# Patient Record
Sex: Female | Born: 1965 | Race: White | Hispanic: No | Smoking: Former smoker
Health system: Southern US, Community
[De-identification: ages and names within clinical notes are randomized; demographics above are authoritative.]

## PROBLEM LIST (undated history)

## (undated) DIAGNOSIS — G4730 Sleep apnea, unspecified: Secondary | ICD-10-CM

## (undated) DIAGNOSIS — I82409 Acute embolism and thrombosis of unspecified deep veins of unspecified lower extremity: Secondary | ICD-10-CM

## (undated) DIAGNOSIS — E785 Hyperlipidemia, unspecified: Secondary | ICD-10-CM

## (undated) DIAGNOSIS — K219 Gastro-esophageal reflux disease without esophagitis: Secondary | ICD-10-CM

## (undated) DIAGNOSIS — M129 Arthropathy, unspecified: Secondary | ICD-10-CM

## (undated) DIAGNOSIS — J45909 Unspecified asthma, uncomplicated: Secondary | ICD-10-CM

## (undated) DIAGNOSIS — F411 Generalized anxiety disorder: Secondary | ICD-10-CM

## (undated) DIAGNOSIS — I10 Essential (primary) hypertension: Secondary | ICD-10-CM

## (undated) HISTORY — DX: Generalized anxiety disorder: F41.1

## (undated) HISTORY — DX: Gastro-esophageal reflux disease without esophagitis: K21.9

## (undated) HISTORY — DX: Sleep apnea, unspecified: G47.30

## (undated) HISTORY — DX: Acute embolism and thrombosis of unspecified deep veins of unspecified lower extremity: I82.409

## (undated) HISTORY — DX: Essential (primary) hypertension: I10

## (undated) HISTORY — DX: Arthropathy, unspecified: M12.9

## (undated) HISTORY — DX: Unspecified asthma, uncomplicated: J45.909

## (undated) HISTORY — DX: Hyperlipidemia, unspecified: E78.5

---

## 2013-08-29 HISTORY — PX: CHOLECYSTECTOMY: SHX55

## 2013-08-29 HISTORY — PX: KNEE SURGERY: SHX244

## 2018-07-30 DIAGNOSIS — E669 Obesity, unspecified: Secondary | ICD-10-CM

## 2018-07-30 DIAGNOSIS — M5412 Radiculopathy, cervical region: Secondary | ICD-10-CM

## 2018-07-30 HISTORY — DX: Obesity, unspecified: E66.9

## 2018-07-30 HISTORY — DX: Radiculopathy, cervical region: M54.12

## 2019-08-30 HISTORY — PX: HERNIA REPAIR: SHX51

## 2020-06-25 DIAGNOSIS — F419 Anxiety disorder, unspecified: Secondary | ICD-10-CM | POA: Insufficient documentation

## 2020-06-25 DIAGNOSIS — M069 Rheumatoid arthritis, unspecified: Secondary | ICD-10-CM

## 2020-06-25 DIAGNOSIS — E119 Type 2 diabetes mellitus without complications: Secondary | ICD-10-CM | POA: Insufficient documentation

## 2020-06-25 DIAGNOSIS — I2699 Other pulmonary embolism without acute cor pulmonale: Secondary | ICD-10-CM | POA: Insufficient documentation

## 2020-06-25 DIAGNOSIS — F32A Depression, unspecified: Secondary | ICD-10-CM

## 2020-06-25 HISTORY — DX: Depression, unspecified: F32.A

## 2020-06-25 HISTORY — DX: Anxiety disorder, unspecified: F41.9

## 2020-06-25 HISTORY — DX: Type 2 diabetes mellitus without complications: E11.9

## 2020-06-25 HISTORY — DX: Rheumatoid arthritis, unspecified: M06.9

## 2020-06-25 HISTORY — DX: Other pulmonary embolism without acute cor pulmonale: I26.99

## 2021-09-28 ENCOUNTER — Encounter: Payer: Self-pay | Admitting: *Deleted

## 2021-09-28 ENCOUNTER — Encounter: Payer: Self-pay | Admitting: Cardiology

## 2021-09-28 DIAGNOSIS — K219 Gastro-esophageal reflux disease without esophagitis: Secondary | ICD-10-CM | POA: Insufficient documentation

## 2021-09-28 DIAGNOSIS — G4730 Sleep apnea, unspecified: Secondary | ICD-10-CM | POA: Insufficient documentation

## 2021-09-28 DIAGNOSIS — J45909 Unspecified asthma, uncomplicated: Secondary | ICD-10-CM | POA: Insufficient documentation

## 2021-11-04 DIAGNOSIS — I82409 Acute embolism and thrombosis of unspecified deep veins of unspecified lower extremity: Secondary | ICD-10-CM | POA: Insufficient documentation

## 2021-11-04 DIAGNOSIS — E785 Hyperlipidemia, unspecified: Secondary | ICD-10-CM | POA: Insufficient documentation

## 2021-11-04 DIAGNOSIS — M129 Arthropathy, unspecified: Secondary | ICD-10-CM | POA: Insufficient documentation

## 2021-11-04 DIAGNOSIS — I10 Essential (primary) hypertension: Secondary | ICD-10-CM | POA: Insufficient documentation

## 2021-11-04 DIAGNOSIS — F411 Generalized anxiety disorder: Secondary | ICD-10-CM | POA: Insufficient documentation

## 2021-11-04 NOTE — Progress Notes (Signed)
?Cardiology Office Note:   ? ?Date:  11/05/2021  ? ?ID:  Renee Parker, DOB [birth year], MRN [id number] ? ?PCP:  Janine Limbo, PA-C  ?Cardiologist:  Shirlee More, MD  ? ?Referring MD: Janine Limbo, PA-C ? ?ASSESSMENT:   ? ?1. Other chest pain   ?2. SOB (shortness of breath)   ?3. Claudication ([place])   ?4. Primary hypertension   ?5. Single subsegmental pulmonary embolism without acute cor pulmonale (HCC)   ?6. Chronic anticoagulation   ? ?PLAN:   ? ?In order of problems listed above: ? ?She has developed a rapidly progressive pattern of exertional shortness of breath and chest pain in the last few months differential diagnosis is sequelae of pulmonary embolism.  Including pulmonary artery hypertension versus CAD.  Further evaluation cardiac CTA. ?Symptoms quite suggestive of claudication check lower extremity segmental pressures ABIs ?Continue current treatment beta-blocker ACE inhibitor ?Continue her long-term anticoagulation Eliquis ?Stable diabetes managed by her PCP ? ? ? ?Next appointment 4 weeks ? ? ?Medication Adjustments/Labs and Tests Ordered: ?Current medicines are reviewed at length with the patient today.  Concerns regarding medicines are outlined above.  ?Orders Placed This Encounter  ?Procedures  ? CT CORONARY MORPH W/CTA COR W/SCORE W/CA W/CM &/OR WO/CM  ? Basic metabolic panel  ? EKG 12-Lead  ? VAS [place] LOWER EXT ART SEG MULTI (SEGMENTALS & LE RAYNAUDS)  ? ?Meds ordered this encounter  ?Medications  ? apixaban (ELIQUIS) 5 MG TABS tablet  ?  Sig: Take 1 tablet (5 mg total) by mouth 2 (two) times daily.  ?  Dispense:  60 tablet  ?  Refill:  11  ? ramipril (ALTACE) 5 MG capsule  ?  Sig: Take 1 capsule (5 mg total) by mouth daily.  ?  Dispense:  90 capsule  ?  Refill:  3  ? fenofibrate (TRICOR) 145 MG tablet  ?  Sig: Take 1 tablet (145 mg total) by mouth daily.  ?  Dispense:  90 tablet  ?  Refill:  3  ? metoprolol tartrate (LOPRESSOR) 100 MG tablet  ?  Sig: Take 1 tablet by mouth 2 hours before CT  ?  Dispense:   1 tablet  ?  Refill:  0  ?  ? ?Chief complaint exertional shortness of breath and chest tightness ? ?History of Present Illness:   ? ?Renee Parker is a 56 y.o. female with a history of DVT and pulmonary embolism 2021, asthma, sleep apnea type 2 diabetes hypertension dyslipidemia and asthma and sleep apnea who is being seen today for the evaluation of shortness of breath at the request of [place], PA-C.  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  She was treated with IVC filter removal and anticoagulation. ? ?She had done well until last few months and she developed a rapidly progressive pattern of shortness of breath with activities walking room to room at home greater than ADLs associated with chest tightness.  She has a brother in the 67s had recent bypass surgery father died in his 71s of CAD and heart failure.  Her anticoagulation has not been interrupted.  She also notices claudication in both legs knees to ankle which is very predictable with activity has no history of spine disease.  She has no edema orthopnea cough wheeze palpitation or syncope.  The symptoms have disrupted her life seeking medical attention. ? ?Past Medical History:  ?  Diagnosis Date  ? Anxiety 06/25/2020  ? Arthropathy   ? Asthma   ? Depression 06/25/2020  ? Diabetes mellitus ([place]) 06/25/2020  ? DVT (deep venous thrombosis) ([place])   ? Has been occuring in decreasing pattern for years  ? Dyslipidemia   ? GAD (generalized anxiety disorder)   ? GERD (gastroesophageal reflux disease)   ? Hypertension   ? Pulmonary emboli ([place]) 06/25/2020  ? Rheumatoid arthritis ([place]) 06/25/2020  ? Sleep apnea   ? CPAP  ? ? ?Past Surgical History:  ?Procedure Laterality Date  ? CHOLECYSTECTOMY  2015  ? HERNIA REPAIR  2021  ? lap hernia repair 09/2016  ? KNEE SURGERY Right 2015  ? Revision 10/2016  ? ? ?Current Medications: ?Current Meds   ?Medication Sig  ? albuterol (VENTOLIN HFA) 108 (90 Base) MCG/ACT inhaler Inhale 1-2 puffs into the lungs every 4 (four) hours as needed for shortness of breath.  ? b complex vitamins capsule Take 1 capsule by mouth daily.  ? BIOTIN PO Take 400 mg by mouth 2 (two) times daily.  ? budesonide-formoterol (SYMBICORT) 160-4.5 MCG/ACT inhaler Inhale 2 puffs into the lungs 2 (two) times daily.  ? busPIRone (BUSPAR) 15 MG tablet Take 1 tablet by mouth 2 (two) times daily.  ? dicyclomine (BENTYL) 20 MG tablet Take 20 mg by mouth daily as needed for spasms.  ? escitalopram (LEXAPRO) 20 MG tablet Take 20 mg by mouth daily.  ? insulin degludec (TRESIBA) 100 UNIT/ML FlexTouch Pen Inject 55 mLs into the skin daily.  ? insulin lispro (HUMALOG) 100 UNIT/ML injection Inject 150 Units into the skin daily.  ? metoprolol tartrate (LOPRESSOR) 100 MG tablet Take 1 tablet by mouth 2 hours before CT  ? omeprazole (PRILOSEC) 40 MG capsule Take 40 mg by mouth 2 (two) times daily.  ? [DISCONTINUED] apixaban (ELIQUIS) 5 MG TABS tablet Take 5 mg by mouth 2 (two) times daily.  ? [DISCONTINUED] fenofibrate (TRICOR) 145 MG tablet Take 145 mg by mouth daily.  ? [DISCONTINUED] ramipril (ALTACE) 5 MG capsule Take 1 tablet by mouth daily.  ?  ? ?Allergies:   Patient has no known allergies.  ? ?Social History  ? ?Socioeconomic History  ? Marital status: Divorced  ?  Spouse name: Not on file  ? Number of children: Not on file  ? Years of education: Not on file  ? Highest education level: Not on file  ?Occupational History  ? Not on file  ?Tobacco Use  ? Smoking status: Former  ?  Years: 5.00  ?  Types: Cigarettes  ?  Quit date: 40  ?  Years since quitting: 29.2  ?  Passive exposure: Past  ? Smokeless tobacco: Never  ?Vaping Use  ? Vaping Use: Never used  ?Substance and Sexual Activity  ? Alcohol use: Not Currently  ?  Comment: Occasional  ? Drug use: Never  ? Sexual activity: Not on file  ?Other Topics Concern  ? Not on file  ?Social History  Narrative  ? Not on file  ? ?Social Determinants of Health  ? ?Financial Resource Strain: Not on file  ?Food Insecurity: Not on file  ?Transportation Needs: Not on file  ?Physical Activity: Not on file  ?Stress: Not on file  ?Social Connections: Not on file  ?  ? ?Family History: ?The patient's family history includes Breast cancer in her maternal grandmother; Diabetes in her father, maternal grandmother, mother, and paternal grandmother; Heart attack in her father; Heart disease in her father; Hyperlipidemia  in her father; Hypertension in her father and mother; Ovarian cancer in her maternal grandmother; Stroke in her maternal grandmother. ? ?ROS:   ?ROS Please see the history of present illness.    ? All other systems reviewed and are negative. ? ?EKGs/Labs/Other Studies Reviewed:   ? ?The following studies were reviewed today: ? ? ?EKG:  EKG is sinus rhythm left axis deviation left anterior hemiblock ordered today.  The ekg ordered today is personally reviewed and demonstrates  ? ?Recent Labs: ?02/23/2021 creatinine 1.05 potassium 4.2 hemoglobin 11.8 ? ?Physical Exam:   ? ?VS:  BP (!) 144/72 (BP Location: Right Arm)   Pulse 81   Ht 5\' 5"  (1.651 m)   Wt 228 lb 9.6 oz (103.7 kg)   SpO2 96%   BMI 38.04 kg/m?    ? ?Wt Readings from Last 3 Encounters:  ?11/05/21 228 lb 9.6 oz (103.7 kg)  ?09/27/21 232 lb (105.2 kg)  ?  ? ?GEN: central obesity  Well nourished, well developed in no acute distress ?HEENT: Normal ?NECK: No JVD; No carotid bruits ?LYMPHATICS: No lymphadenopathy ?CARDIAC: Patient seen intermixed RRR, no murmurs, rubs, gallops ?RESPIRATORY:  Clear to auscultation without rales, wheezing or rhonchi  ?ABDOMEN: Soft, non-tender, non-distended ?MUSCULOSKELETAL:  No edema; No deformity  ?SKIN: Warm and dry ?NEUROLOGIC:  Alert and oriented x 3 ?PSYCHIATRIC:  Normal affect  ? ? ? ?Signed, ?Shirlee More, MD  ?11/05/2021 4:56 PM    ?[place] ?

## 2021-11-05 ENCOUNTER — Ambulatory Visit: Payer: BC Managed Care – PPO | Admitting: Cardiology

## 2021-11-05 ENCOUNTER — Other Ambulatory Visit: Payer: Self-pay

## 2021-11-05 ENCOUNTER — Encounter: Payer: Self-pay | Admitting: Cardiology

## 2021-11-05 VITALS — BP 144/72 | HR 81 | Ht 65.0 in | Wt 228.6 lb

## 2021-11-05 DIAGNOSIS — R0602 Shortness of breath: Secondary | ICD-10-CM

## 2021-11-05 DIAGNOSIS — I10 Essential (primary) hypertension: Secondary | ICD-10-CM

## 2021-11-05 DIAGNOSIS — I739 Peripheral vascular disease, unspecified: Secondary | ICD-10-CM | POA: Diagnosis not present

## 2021-11-05 DIAGNOSIS — I2693 Single subsegmental pulmonary embolism without acute cor pulmonale: Secondary | ICD-10-CM

## 2021-11-05 DIAGNOSIS — R0789 Other chest pain: Secondary | ICD-10-CM

## 2021-11-05 DIAGNOSIS — Z7901 Long term (current) use of anticoagulants: Secondary | ICD-10-CM

## 2021-11-05 MED ORDER — APIXABAN 5 MG PO TABS: 5.0000 mg | ORAL_TABLET | Freq: Two times a day (BID) | ORAL | 11 refills | Status: DC

## 2021-11-05 MED ORDER — METOPROLOL TARTRATE 100 MG PO TABS: ORAL_TABLET | ORAL | 0 refills | Status: DC

## 2021-11-05 MED ORDER — FENOFIBRATE 145 MG PO TABS: 145.0000 mg | ORAL_TABLET | Freq: Every day | ORAL | 3 refills | Status: DC

## 2021-11-05 MED ORDER — RAMIPRIL 5 MG PO CAPS: 5.0000 mg | ORAL_CAPSULE | Freq: Every day | ORAL | 3 refills | Status: DC

## 2021-11-05 NOTE — Patient Instructions (Addendum)
Medication Instructions:  ?Your physician recommends that you continue on your current medications as directed. Please refer to the Current Medication list given to you today. ? ?*If you need a refill on your cardiac medications before your next appointment, please call your pharmacy* ? ? ?Lab Work: ?Bmp- today  ? ?If you have labs (blood work) drawn today and your tests are completely normal, you will receive your results only by: ?MyChart Message (if you have MyChart) OR ?A paper copy in the mail ?If you have any lab test that is abnormal or we need to change your treatment, we will call you to review the results. ? ? ?Testing/Procedures: ?Your physician has requested that you have a lower extremity arterial exercise duplex. During this test, exercise and ultrasound are used to evaluate arterial blood flow in the legs. Allow one hour for this exam. There are no restrictions or special instructions. ? ?Your physician has ordered for you to have a cardia cta *refer to instructions given* ? ? ?Follow-Up: ?At [facility], you and your health needs are our priority.  As part of our continuing mission to provide you with exceptional heart care, we have created designated Provider Care Teams.  These Care Teams include your primary Cardiologist (physician) and Advanced Practice Providers (APPs -  Physician Assistants and Nurse Practitioners) who all work together to provide you with the care you need, when you need it. ? ?We recommend signing up for the patient portal called "MyChart".  Sign up information is provided on this After Visit Summary.  MyChart is used to connect with patients for Virtual Visits (Telemedicine).  Patients are able to view lab/test results, encounter notes, upcoming appointments, etc.  Non-urgent messages can be sent to your provider as well.   ?To learn more about what you can do with MyChart, go to [email or web address].   ? ?Your next appointment:   ?4 week(s) ? ?The format for your next  appointment:   ?In Person ? ?Provider:   ?Norman Herrlich, MD  ? ? ?Other Instructions ? ? ?Your cardiac CT will be scheduled at one of the below locations:  ? ?[facility] ?[address] ?[place], [place] [address] ?[phone] ? ? ?If scheduled at [facility], please arrive at the [facility] and Children's Entrance (Entrance C2) of [facility] 30 minutes prior to test start time. ?You can use the FREE valet parking offered at entrance C (encouraged to control the heart rate for the test)  ?Proceed to the [facility] Radiology Department (first floor) to check-in and test prep. ? ?All radiology patients and guests should use entrance C2 at [facility], accessed from [facility], even though the hospital's physical address listed is [address] ? ? ? ?Please follow these instructions carefully (unless otherwise directed): ? ? ?On the Night Before the Test: ?Be sure to Drink plenty of water. ?Do not consume any caffeinated/decaffeinated beverages or chocolate 12 hours prior to your test. ?Do not take any antihistamines 12 hours prior to your test. ?On the Day of the Test: ?Drink plenty of water until 1 hour prior to the test. ?Do not eat any food 4 hours prior to the test. ?You may take your regular medications prior to the test.  ?Take metoprolol (Lopressor) two hours prior to test. ?FEMALES- please wear underwire-free bra if available, avoid dresses & tight clothing ? ?     ?After the Test: ?Drink plenty of water. ?After receiving IV contrast,  you may experience a mild flushed feeling. This is normal. ?On occasion, you may experience a mild rash up to 24 hours after the test. This is not dangerous. If this occurs, you can take Benadryl 25 mg and increase your fluid intake. ?If you experience trouble breathing, this can be serious. If it is severe call 911 IMMEDIATELY. If it is mild, please call our office. ? ?We will call to schedule your test 2-4 weeks out  understanding that some insurance companies will need an authorization prior to the service being performed.  ? ?For non-scheduling related questions, please contact the cardiac imaging nurse navigator should you have any questions/concerns: ?Rockwell [place], Cardiac Imaging Nurse Navigator ?Larey Brick, Cardiac Imaging Nurse Navigator ?[facility] ?Direct Office Dial: [phone]  ? ?For scheduling needs, including cancellations and rescheduling, please call [place], [phone]. ?  ?

## 2021-11-06 LAB — BASIC METABOLIC PANEL
BUN/Creatinine Ratio: 17 (ref 9–23)
BUN: 12 mg/dL (ref 6–24)
CO2: 26 mmol/L (ref 20–29)
Calcium: 10 mg/dL (ref 8.7–10.2)
Chloride: 101 mmol/L (ref 96–106)
Creatinine, Ser: 0.71 mg/dL (ref 0.57–1.00)
Glucose: 199 mg/dL — ABNORMAL HIGH (ref 70–99)
Potassium: 4.5 mmol/L (ref 3.5–5.2)
Sodium: 142 mmol/L (ref 134–144)
eGFR: 100 mL/min/{1.73_m2} (ref >59)

## 2021-11-09 ENCOUNTER — Telehealth: Payer: Self-pay | Admitting: *Deleted

## 2021-11-09 NOTE — Telephone Encounter (Signed)
Left message for pt to call us back for blood work results. ?

## 2021-11-09 NOTE — Telephone Encounter (Signed)
-----   Message from Baldo Daub, MD sent at 11/07/2021 10:25 AM EDT ----- ?Normal or stable result ?

## 2021-11-15 ENCOUNTER — Other Ambulatory Visit: Payer: Self-pay

## 2021-11-15 ENCOUNTER — Ambulatory Visit (INDEPENDENT_AMBULATORY_CARE_PROVIDER_SITE_OTHER): Payer: BC Managed Care – PPO

## 2021-11-15 DIAGNOSIS — I739 Peripheral vascular disease, unspecified: Secondary | ICD-10-CM | POA: Diagnosis not present

## 2021-12-02 ENCOUNTER — Ambulatory Visit (HOSPITAL_COMMUNITY): Payer: BC Managed Care – PPO

## 2021-12-06 ENCOUNTER — Ambulatory Visit: Payer: BC Managed Care – PPO | Admitting: Cardiology

## 2021-12-09 ENCOUNTER — Telehealth (HOSPITAL_COMMUNITY): Payer: Self-pay | Admitting: *Deleted

## 2021-12-09 NOTE — Telephone Encounter (Signed)
Attempted to call patient regarding upcoming cardiac CT appointment. °Left message on voicemail with name and callback number ° °Ledia Hanford RN Navigator Cardiac Imaging °[facility] °[phone] Office °[phone] Cell ° °

## 2021-12-10 ENCOUNTER — Other Ambulatory Visit (HOSPITAL_COMMUNITY): Payer: Self-pay | Admitting: *Deleted

## 2021-12-10 ENCOUNTER — Other Ambulatory Visit: Payer: Self-pay | Admitting: Cardiology

## 2021-12-10 ENCOUNTER — Ambulatory Visit (HOSPITAL_COMMUNITY)
Admission: RE | Admit: 2021-12-10 | Discharge: 2021-12-10 | Disposition: A | Discharge status: Home / Self Care | Payer: BC Managed Care – PPO | Source: Ambulatory Visit | Attending: Cardiology | Admitting: Cardiology

## 2021-12-10 DIAGNOSIS — R0789 Other chest pain: Secondary | ICD-10-CM | POA: Diagnosis not present

## 2021-12-10 DIAGNOSIS — R079 Chest pain, unspecified: Secondary | ICD-10-CM

## 2021-12-10 MED ORDER — METOPROLOL TARTRATE 5 MG/5ML IV SOLN: 5.0000 mg | INTRAVENOUS | Status: DC | PRN

## 2021-12-10 MED ORDER — METOPROLOL TARTRATE 5 MG/5ML IV SOLN
INTRAVENOUS | Status: AC
  Administered 2021-12-10: 5 mg via INTRAVENOUS
  Filled 2021-12-10: qty 5

## 2021-12-10 MED ORDER — NITROGLYCERIN 0.4 MG SL SUBL
SUBLINGUAL_TABLET | SUBLINGUAL | Status: AC
  Filled 2021-12-10: qty 2

## 2021-12-10 MED ORDER — SODIUM CHLORIDE 0.9 % IV SOLN: Freq: Once | INTRAVENOUS | Status: AC

## 2021-12-10 MED ORDER — NITROGLYCERIN 0.4 MG SL SUBL
0.8000 mg | SUBLINGUAL_TABLET | Freq: Once | SUBLINGUAL | Status: AC
  Administered 2021-12-10: 0.8 mg via SUBLINGUAL

## 2021-12-10 MED ORDER — IVABRADINE HCL 5 MG PO TABS: ORAL_TABLET | ORAL | 0 refills | Status: DC

## 2021-12-10 MED ORDER — METOPROLOL TARTRATE 50 MG PO TABS: ORAL_TABLET | ORAL | 0 refills | Status: DC

## 2021-12-10 NOTE — Progress Notes (Signed)
Patient placed in the scanner for a CT Cardiac study.  Once in the scanner, patient HR jumped from 68 to 90's.  Applied BP cuff to patient to see if she had any room for more medications to bring the HR down.  Upon rechecking, pressure was 94/56.  The patient states that she "feels hot in the scanner and maybe a little nervous while in the scanner.  Informed after speaking to the MD that she will need to be rescheduled.  At this time, I believe the patient vagals down, turns pale, diaphoretic.  Patient was sitting, she is layed back down and fluids (NS) administered at rapid bolus, BP 76/56.  Patient moved to a stretcher, placed in trendelenburg and moved back to the nurses station for monitoring.   ?

## 2021-12-10 NOTE — Progress Notes (Signed)
Sending 10mg  ivbradine and 50mg  metoprolol for patient's next CCTA attempt per after consult with Dr. Bing Matter. ? ?Larey Brick RN Navigator Cardiac Imaging ?[facility] ?[phone] Office ?[phone] Cell ? ?

## 2021-12-10 NOTE — Progress Notes (Signed)
Spoke with Dr regarding patient BP 107/56 and fluids completed.  Patient has a slight headache and mild dizziness but able to stand at bedside with no wobbling or change in movements.  IV discontinued and to be discharge with MD approval.  Dominga Ferry at bedside to assess patient and notify MD. ? ?

## 2021-12-16 ENCOUNTER — Telehealth: Payer: Self-pay | Admitting: Cardiology

## 2021-12-16 ENCOUNTER — Telehealth: Payer: Self-pay

## 2021-12-16 ENCOUNTER — Other Ambulatory Visit: Payer: Self-pay

## 2021-12-16 NOTE — Telephone Encounter (Signed)
Patient was returning call for results. Please advise °

## 2021-12-16 NOTE — Telephone Encounter (Signed)
Patient called and stated that she had a CT ?

## 2021-12-17 ENCOUNTER — Other Ambulatory Visit: Payer: Self-pay

## 2021-12-17 DIAGNOSIS — R079 Chest pain, unspecified: Secondary | ICD-10-CM

## 2021-12-17 NOTE — Telephone Encounter (Signed)
Patient informed of results.  

## 2022-01-05 ENCOUNTER — Telehealth: Payer: Self-pay

## 2022-01-05 NOTE — Telephone Encounter (Signed)
Patient is scheduled for her lexiscan per Hayden RasmussenKalie Lambeth ?

## 2022-01-05 NOTE — Telephone Encounter (Signed)
-----   Message from Bridgette Habermann sent at 01/03/2022  8:45 AM EDT ----- ?Regarding: RE: lexiscan ?She is scheduled :) ?----- Message ----- ?From: Louie Casa, RN ?Sent: 12/31/2021   2:10 PM EDT ?To: Bridgette Habermann ?Subject: FW: lexiscan                                  ? ?Were you able to schedule this patient's lexiscan? ?----- Message ----- ?From: Enrigue Catena B ?Sent: 12/20/2021  10:58 AM EDT ?To: Louie Casa, RN ?Subject: RE: lexiscan                                  ? ?LVM for pt to call and schedule testing/kbl 12/20/21 ?----- Message ----- ?From: Louie Casa, RN ?Sent: 12/17/2021   4:02 PM EDT ?To: Bridgette Habermann ?Subject: lexiscan                                      ? ?Can you please call this patient and schedule her lexiscan. ? ?Thanks ? ? ? ? ? ? ? ? ? ?

## 2022-02-01 ENCOUNTER — Telehealth (HOSPITAL_COMMUNITY): Payer: Self-pay | Admitting: *Deleted

## 2022-02-01 NOTE — Telephone Encounter (Signed)
Left message on voicemail per DPR in reference to upcoming appointment scheduled on 02/08/22 at 8:15 with detailed instructions given per Myocardial Perfusion Study Information Sheet for the test. LM to arrive 15 minutes early, and that it is imperative to arrive on time for appointment to keep from having the test rescheduled. If you need to cancel or reschedule your appointment, please call the office within 24 hours of your appointment. Failure to do so may result in a cancellation of your appointment, and a $50 no show fee. Phone number given for call back for any questions.

## 2022-02-03 ENCOUNTER — Other Ambulatory Visit: Payer: Self-pay

## 2022-02-03 ENCOUNTER — Telehealth (HOSPITAL_COMMUNITY): Payer: Self-pay

## 2022-02-03 ENCOUNTER — Telehealth (HOSPITAL_COMMUNITY): Payer: Self-pay | Admitting: *Deleted

## 2022-02-03 DIAGNOSIS — R079 Chest pain, unspecified: Secondary | ICD-10-CM

## 2022-02-03 NOTE — Telephone Encounter (Signed)
Rescheduled for 6/21 and 02/17/22

## 2022-02-03 NOTE — Telephone Encounter (Signed)
Left message for patient to call and reschedule appointment for stress test on 02/08/22.  No physician in the office that day.

## 2022-02-08 ENCOUNTER — Ambulatory Visit: Payer: BC Managed Care – PPO

## 2022-02-09 ENCOUNTER — Telehealth (HOSPITAL_COMMUNITY): Payer: Self-pay | Admitting: *Deleted

## 2022-02-09 ENCOUNTER — Ambulatory Visit: Payer: BC Managed Care – PPO

## 2022-02-09 NOTE — Telephone Encounter (Signed)
Attempted to call patient regarding upcoming appointment- no answer, unable to leave a message.  Tauriel Scronce Jacqueline  

## 2022-02-16 ENCOUNTER — Ambulatory Visit: Payer: BC Managed Care – PPO

## 2022-02-17 ENCOUNTER — Ambulatory Visit: Payer: BC Managed Care – PPO

## 2022-10-24 ENCOUNTER — Other Ambulatory Visit: Payer: Self-pay | Admitting: Cardiology

## 2022-10-24 NOTE — Telephone Encounter (Signed)
Ramipril 5 mg # 90 only, message patient needs appointment for future refills 1st attempt

## 2022-11-15 ENCOUNTER — Other Ambulatory Visit: Payer: Self-pay | Admitting: Cardiology

## 2022-11-15 DIAGNOSIS — I2693 Single subsegmental pulmonary embolism without acute cor pulmonale: Secondary | ICD-10-CM

## 2022-11-15 NOTE — Telephone Encounter (Signed)
Prescription refill request for Eliquis received. Indication:PE/DVT Last office visit: 11/05/21 [facility])  Scr:  0.71 (11/05/21)  Age: 57 Weight: 103.7kg  Office visit and labs overdue. Called pt, no answer. Left message on voicemail

## 2022-11-16 NOTE — Telephone Encounter (Signed)
Pt has scheduled appt on 01/24/1923 with Dr Bettina Gavia. Refill sent to last until upcoming appt. Note placed on appt to have labs drawn at appt.

## 2023-01-17 ENCOUNTER — Other Ambulatory Visit: Payer: Self-pay

## 2023-01-23 NOTE — Progress Notes (Unsigned)
Cardiology Office Note:    Date:  01/24/2023   ID:  Renee Parker, DOB Nov 08, 1965, MRN [id number]  PCP:  Renee Blase, PA-C  Cardiologist:  Renee Herrlich, MD    Referring MD: Renee Blase, PA-C    ASSESSMENT:    1. Chronic anticoagulation   2. SOB (shortness of breath)   3. Primary hypertension    PLAN:    In order of problems listed above:  Continue her current anticoagulant long-term treatment advise lifelong anticoagulation after repeat pulmonary embolism She remains quite short of breath following pulmonary embolism had an elevated right hemidiaphragm is obstructive sleep apnea referred to pulmonary for an opinion also check labs with her anticoagulant including CBC proBNP Also check echocardiogram for pulmonary hypertension RV dysfunction Continue ACE inhibitor   Next appointment: 1 year   Medication Adjustments/Labs and Tests Ordered: Current medicines are reviewed at length with the patient today.  Concerns regarding medicines are outlined above.  No orders of the defined types were placed in this encounter.  No orders of the defined types were placed in this encounter.   No chief complaint on file.   History of Present Illness:    Renee Parker is a 57 y.o. female with a hx of previous DVT and pulmonary embolism 2021 asthma and sleep apnea type 2 diabetes hypertension dyslipidemia last seen 11/05/2021 for shortness of breath.  She had a coronary calcium score performed 12/10/2021 showing a coronary artery calcium score of 0 was not felt to benefit from an ischemia evaluation over read of the CT scan showed severe elevation of the right hemidiaphragm with atelectasis of the right lower lobe.  Duplex lower extremity 11/15/2021 showing no findings of thrombophlebitis.  Compliance with diet, lifestyle and medications: Yes  She remains quite short of breath with anything more than usual activities and request referral to pulmonary He just had a repeat home sleep study  done through PCP Not having chest pain edema palpitations or syncope Tolerates her anticoagulant without bleeding.  She is here today for refill Past Medical History:  Diagnosis Date   Anxiety 06/25/2020   Arthropathy    Asthma    Depression 06/25/2020   Diabetes mellitus (HCC) 06/25/2020   DVT (deep venous thrombosis) (HCC)    Has been occuring in decreasing pattern for years   Dyslipidemia    GAD (generalized anxiety disorder)    GERD (gastroesophageal reflux disease)    Hypertension    Pulmonary emboli (HCC) 06/25/2020   Rheumatoid arthritis (HCC) 06/25/2020   Sleep apnea    CPAP    Past Surgical History:  Procedure Laterality Date   CHOLECYSTECTOMY  2015   HERNIA REPAIR  2021   lap hernia repair 09/2016   KNEE SURGERY Right 2015   Revision 10/2016    Current Medications: Current Meds  Medication Sig   albuterol (VENTOLIN HFA) 108 (90 Base) MCG/ACT inhaler Inhale 1-2 puffs into the lungs every 4 (four) hours as needed for shortness of breath.   apixaban (ELIQUIS) 5 MG TABS tablet TAKE 1 TABLET(5 MG) BY MOUTH TWICE DAILY   b complex vitamins capsule Take 1 capsule by mouth daily.   BIOTIN PO Take 400 mg by mouth 2 (two) times daily.   budesonide-formoterol (SYMBICORT) 160-4.5 MCG/ACT inhaler Inhale 2 puffs into the lungs 2 (two) times daily.   busPIRone (BUSPAR) 15 MG tablet Take 1 tablet by mouth 2 (two) times daily.   escitalopram (LEXAPRO) 20 MG tablet Take 20 mg by mouth daily.   fenofibrate (  TRICOR) 145 MG tablet Take 1 tablet (145 mg total) by mouth daily.   insulin lispro (HUMALOG) 100 UNIT/ML injection Inject 150 Units into the skin daily.   omeprazole (PRILOSEC) 40 MG capsule Take 40 mg by mouth 2 (two) times daily.   OZEMPIC, 2 MG/DOSE, 8 MG/3ML SOPN Inject 2 mg into the skin once a week.   ramipril (ALTACE) 5 MG capsule Take 1 capsule (5 mg total) by mouth daily. Needs appointment for future refills / 1st attempt     Allergies:   Patient has no known  allergies.   Social History   Socioeconomic History   Marital status: Divorced    Spouse name: Not on file   Number of children: Not on file   Years of education: Not on file   Highest education level: Not on file  Occupational History   Not on file  Tobacco Use   Smoking status: Former    Years: 5    Types: Cigarettes    Quit date: 28    Years since quitting: 30.4    Passive exposure: Past   Smokeless tobacco: Never  Vaping Use   Vaping Use: Never used  Substance and Sexual Activity   Alcohol use: Not Currently    Comment: Occasional   Drug use: Never   Sexual activity: Not on file  Other Topics Concern   Not on file  Social History Narrative   Not on file   Social Determinants of Health   Financial Resource Strain: Not on file  Food Insecurity: Not on file  Transportation Needs: Not on file  Physical Activity: Not on file  Stress: Not on file  Social Connections: Not on file     Family History: The patient's family history includes Breast cancer in her maternal grandmother; Diabetes in her father, maternal grandmother, mother, and paternal grandmother; Heart attack in her father; Heart disease in her father; Hyperlipidemia in her father; Hypertension in her father and mother; Ovarian cancer in her maternal grandmother; Stroke in her maternal grandmother. ROS:   Please see the history of present illness.    All other systems reviewed and are negative.  EKGs/Labs/Other Studies Reviewed:    The following studies were reviewed today:  Cardiac Studies & Procedures          CT SCANS  CT CARDIAC SCORING (SELF PAY ONLY) 12/15/2021  Addendum 12/15/2021  9:51 AM ADDENDUM REPORT: 12/15/2021 09:49  CLINICAL DATA:  Risk stratification  EXAM: Coronary Calcium Score  TECHNIQUE: The patient was scanned on a [employer] scanner. Axial non-contrast 3 mm slices were carried out through the heart. The data set was analyzed on a dedicated work station and scored  using the Agatson method.  FINDINGS: Non-cardiac: See separate report from [facility].  Ascending Aorta: Normal  Pericardium: Normal  Coronary arteries: Normal origin  IMPRESSION: Coronary calcium score of 0.   Electronically Signed By: Gypsy Balsam M.D. On: 12/15/2021 09:49  Narrative EXAM: OVER-READ INTERPRETATION  CT CHEST  The following report is an over-read performed by radiologist Dr. Trudie Reed of [facility] on 12/10/2021. This over-read does not include interpretation of cardiac or coronary anatomy or pathology. The coronary calcium score interpretation by the cardiologist is attached.  COMPARISON:  None.  FINDINGS: Severe elevation of the right hemidiaphragm with subsegmental atelectasis or scarring in the right lower lobe. Within the visualized portions of the thorax there are no suspicious appearing pulmonary nodules or masses, there is no acute consolidative airspace  disease, no pleural effusions, no pneumothorax and no lymphadenopathy. Visualized portions of the upper abdomen are unremarkable. There are no aggressive appearing lytic or blastic lesions noted in the visualized portions of the skeleton.  IMPRESSION: 1. Severe elevation of the right hemidiaphragm.  Electronically Signed: By: Trudie Reed M.D. On: 12/10/2021 10:36           Recent Labs: 11/08/2021 BMP was normal potassium 4.5 creatinine 0.71 last CBC August 2022 hemoglobin 14 platelets 312,000 Lipid profile 04/20/2021 cholesterol 174 LDL 110  Physical Exam:    VS:  BP 128/76   Pulse 98   Ht 5\' 5"  (1.651 m)   Wt 218 lb 6.4 oz (99.1 kg)   SpO2 93%   BMI 36.34 kg/m     Wt Readings from Last 3 Encounters:  01/24/23 218 lb 6.4 oz (99.1 kg)  11/05/21 228 lb 9.6 oz (103.7 kg)  09/27/21 232 lb (105.2 kg)     GEN:  Well nourished, well developed in no acute distress HEENT: Normal NECK: No JVD; No carotid bruits LYMPHATICS: No  lymphadenopathy CARDIAC: RRR, no murmurs, rubs, gallops RESPIRATORY:  Clear to auscultation without rales, wheezing or rhonchi  ABDOMEN: Soft, non-tender, non-distended MUSCULOSKELETAL:  No edema; No deformity  SKIN: Warm and dry NEUROLOGIC:  Alert and oriented x 3 PSYCHIATRIC:  Normal affect    Signed, Renee Herrlich, MD  01/24/2023 4:46 PM     [facility]

## 2023-01-24 ENCOUNTER — Ambulatory Visit: Payer: BC Managed Care – PPO | Attending: Cardiology | Admitting: Cardiology

## 2023-01-24 ENCOUNTER — Encounter: Payer: Self-pay | Admitting: Cardiology

## 2023-01-24 VITALS — BP 128/76 | HR 98 | Ht 65.0 in | Wt 218.4 lb

## 2023-01-24 DIAGNOSIS — Z7901 Long term (current) use of anticoagulants: Secondary | ICD-10-CM | POA: Diagnosis not present

## 2023-01-24 DIAGNOSIS — R0602 Shortness of breath: Secondary | ICD-10-CM | POA: Diagnosis not present

## 2023-01-24 DIAGNOSIS — I10 Essential (primary) hypertension: Secondary | ICD-10-CM

## 2023-01-24 NOTE — Patient Instructions (Addendum)
Medication Instructions:  Your physician recommends that you continue on your current medications as directed. Please refer to the Current Medication list given to you today.   *If you need a refill on your cardiac medications before your next appointment, please call your pharmacy*   Lab Work: Your physician recommends that you return for lab work in:   Labs today: BMP, CBC, Pro BNP  If you have labs (blood work) drawn today and your tests are completely normal, you will receive your results only by: MyChart Message (if you have MyChart) OR A paper copy in the mail If you have any lab test that is abnormal or we need to change your treatment, we will call you to review the results.   Testing/Procedures: Your physician has requested that you have an echocardiogram. Echocardiography is a painless test that uses sound waves to create images of your heart. It provides your doctor with information about the size and shape of your heart and how well your heart's chambers and valves are working. This procedure takes approximately one hour. There are no restrictions for this procedure. Please do NOT wear cologne, perfume, aftershave, or lotions (deodorant is allowed). Please arrive 15 minutes prior to your appointment time.    Follow-Up: At [facility], you and your health needs are our priority.  As part of our continuing mission to provide you with exceptional heart care, we have created designated Provider Care Teams.  These Care Teams include your primary Cardiologist (physician) and Advanced Practice Providers (APPs -  Physician Assistants and Nurse Practitioners) who all work together to provide you with the care you need, when you need it.  We recommend signing up for the patient portal called "MyChart".  Sign up information is provided on this After Visit Summary.  MyChart is used to connect with patients for Virtual Visits (Telemedicine).  Patients are able to view lab/test  results, encounter notes, upcoming appointments, etc.  Non-urgent messages can be sent to your provider as well.   To learn more about what you can do with MyChart, go to [email or web address].    Your next appointment:   1 year(s)  Provider:   Norman Herrlich, MD    Other Instructions None

## 2023-02-03 ENCOUNTER — Telehealth: Payer: Self-pay

## 2023-02-03 LAB — BASIC METABOLIC PANEL
BUN/Creatinine Ratio: 18 (ref 9–23)
BUN: 10 mg/dL (ref 6–24)
CO2: 29 mmol/L (ref 20–29)
Calcium: 9.9 mg/dL (ref 8.7–10.2)
Chloride: 100 mmol/L (ref 96–106)
Creatinine, Ser: 0.55 mg/dL — ABNORMAL LOW (ref 0.57–1.00)
Glucose: 138 mg/dL — ABNORMAL HIGH (ref 70–99)
Potassium: 4.3 mmol/L (ref 3.5–5.2)
Sodium: 141 mmol/L (ref 134–144)
eGFR: 107 mL/min/{1.73_m2} (ref >59)

## 2023-02-03 LAB — PRO B NATRIURETIC PEPTIDE: NT-Pro BNP: 49 pg/mL (ref 0–287)

## 2023-02-03 LAB — CBC
Hematocrit: 43 % (ref 34.0–46.6)
Hemoglobin: 14.1 g/dL (ref 11.1–15.9)
MCH: 29.4 pg (ref 26.6–33.0)
MCHC: 32.8 g/dL (ref 31.5–35.7)
MCV: 90 fL (ref 79–97)
Platelets: 268 10*3/uL (ref 150–450)
RBC: 4.79 x10E6/uL (ref 3.77–5.28)
RDW: 12.8 % (ref 11.7–15.4)
WBC: 9.6 10*3/uL (ref 3.4–10.8)

## 2023-02-03 NOTE — Telephone Encounter (Signed)
Pt returned call, informed pt provider notes have been posted via mychart as well.

## 2023-02-03 NOTE — Telephone Encounter (Signed)
Patient notified through my chart.

## 2023-02-03 NOTE — Telephone Encounter (Signed)
-----   Message from Baldo Daub, MD sent at 02/03/2023  7:37 AM EDT ----- Normal or stable result

## 2023-02-22 ENCOUNTER — Ambulatory Visit: Payer: BC Managed Care – PPO | Attending: Cardiology

## 2023-02-22 DIAGNOSIS — I10 Essential (primary) hypertension: Secondary | ICD-10-CM

## 2023-02-22 DIAGNOSIS — R0602 Shortness of breath: Secondary | ICD-10-CM | POA: Diagnosis not present

## 2023-02-22 DIAGNOSIS — Z7901 Long term (current) use of anticoagulants: Secondary | ICD-10-CM

## 2023-02-22 LAB — ECHOCARDIOGRAM COMPLETE: S' Lateral: 3.3 cm

## 2023-03-08 ENCOUNTER — Institutional Professional Consult (permissible substitution): Payer: BC Managed Care – PPO | Admitting: Pulmonary Disease

## 2023-03-21 ENCOUNTER — Other Ambulatory Visit: Payer: Self-pay | Admitting: Cardiology

## 2023-03-31 ENCOUNTER — Other Ambulatory Visit: Payer: Self-pay | Admitting: Cardiology

## 2023-03-31 DIAGNOSIS — I2693 Single subsegmental pulmonary embolism without acute cor pulmonale: Secondary | ICD-10-CM

## 2023-03-31 NOTE — Telephone Encounter (Signed)
Prescription refill request for Eliquis received. Indication:dvt Last office visit:5/24 Scr:0.55 6/24 Age: 57 Weight:99.1  kg  Prescription refilled

## 2023-05-01 IMAGING — CT CT CARDIAC CORONARY ARTERY CALCIUM SCORE
3 series · 14 of 20 positions shown, 15 images · non-contrast
Comparison: None.
COMPARISON: None.

Addendum:
EXAM:
OVER-READ INTERPRETATION  CT CHEST

The following report is an over-read performed by radiologist Dr.
Hosea Bloomquist [REDACTED] on 12/10/2021. This
over-read does not include interpretation of cardiac or coronary
anatomy or pathology. The coronary calcium score interpretation by
the cardiologist is attached.
CLINICAL DATA: Risk stratification
Coronary Calcium Score
TECHNIQUE: The patient was scanned on a [employer] scanner. Axial
non-contrast 3 mm slices were carried out through the heart. The
data set was analyzed on a dedicated work station and scored using
the Agatson method.

[Series 3: ds_cascseq 3.0 sa36 70% (id) · axial · 0.39mm/px · z∈[+1281,+1371]mm · 6 of 85 slices shown]
[im 13/85  vessel]
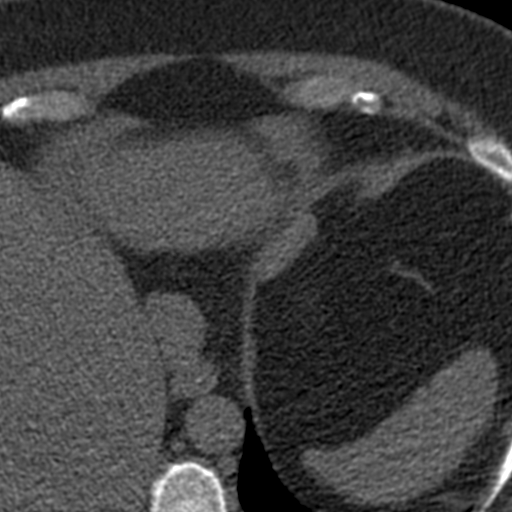
[im 25/85  vessel]
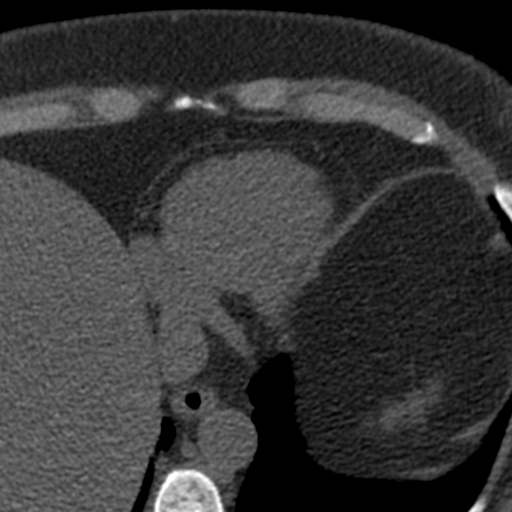
[im 37/85  vessel]
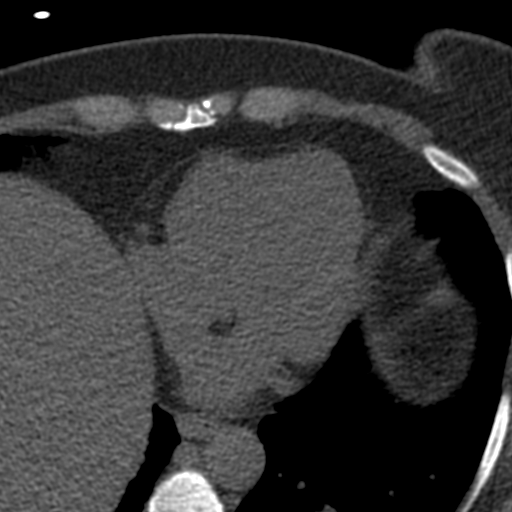
[im 49/85  vessel]
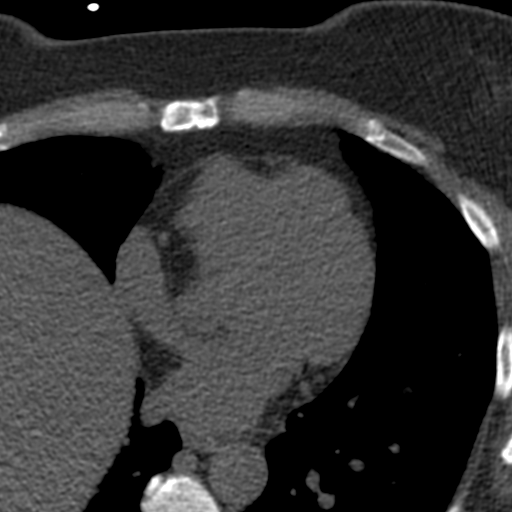
[im 61/85  vessel]
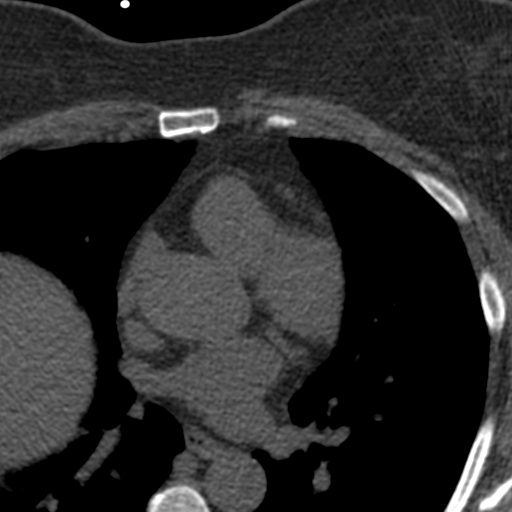
[im 73/85  vessel]
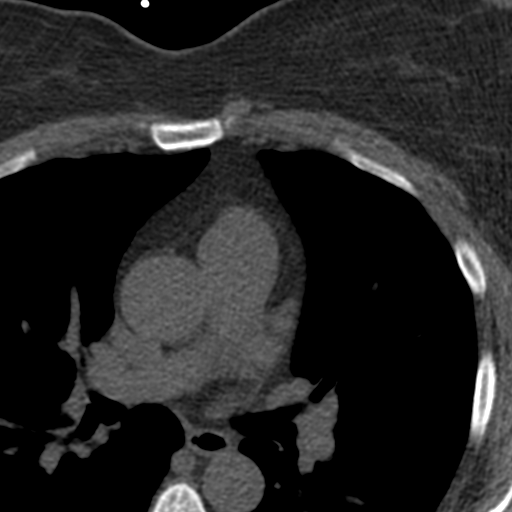

[Series 4: ds_cascseq 2.0 sa36 70% (id) · axial · 0.77mm/px · z∈[+1288,+1364]mm · 4 of 64 slices shown]
[im 13/64  vessel]
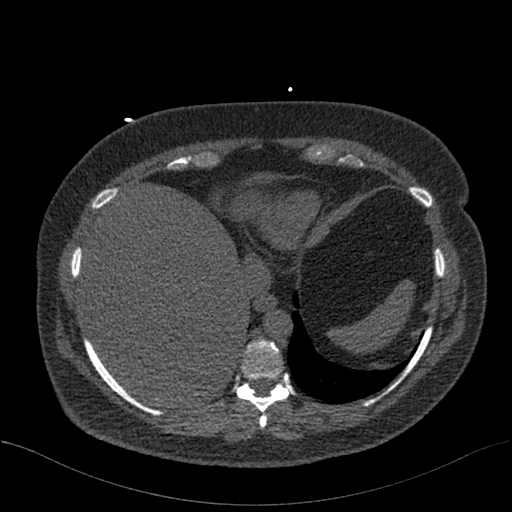
[im 26/64  vessel]
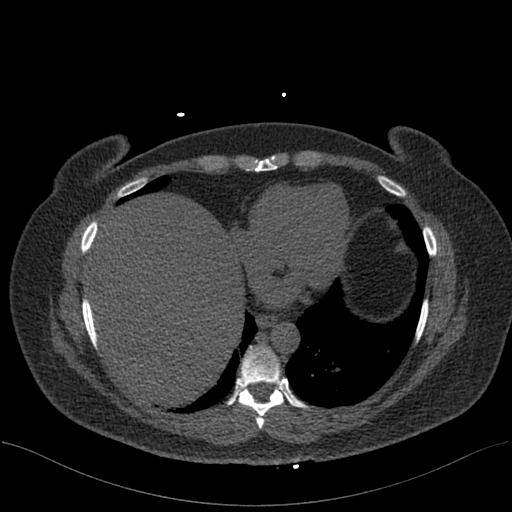
[im 38/64  vessel]
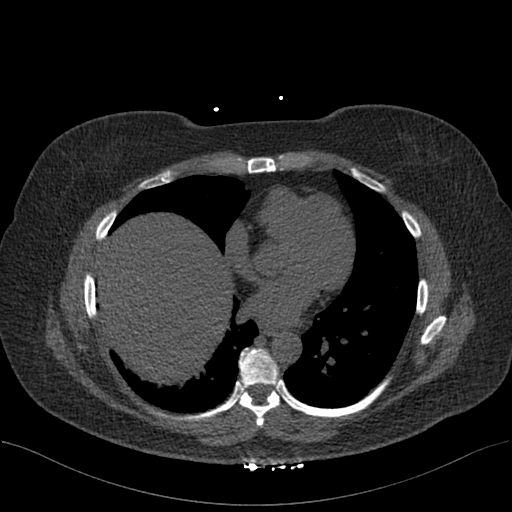
[im 51/64  vessel]
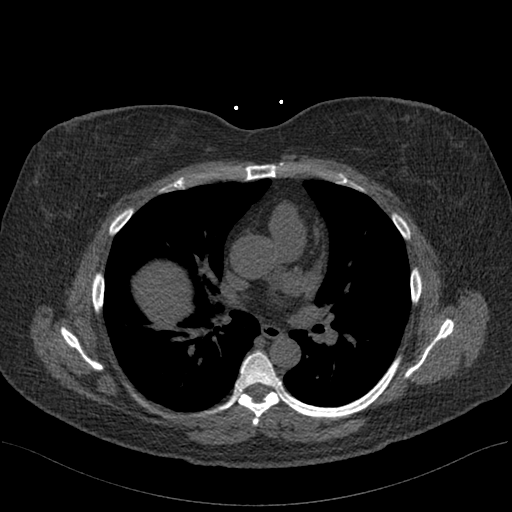

[Series 5: lungs full fov · axial · 0.77mm/px · z∈[+1288,+1364]mm · 4 of 64 slices shown, 5 images]
[im 13/64  vessel]
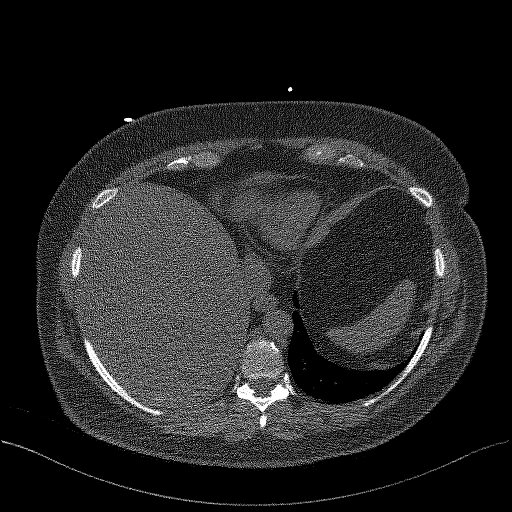
[im 13/64  lung]
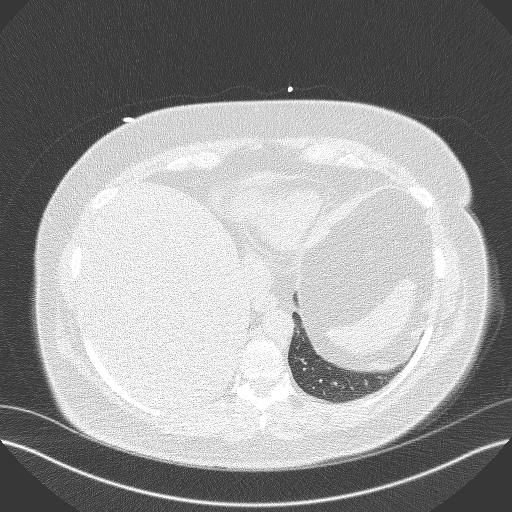
[im 26/64  vessel]
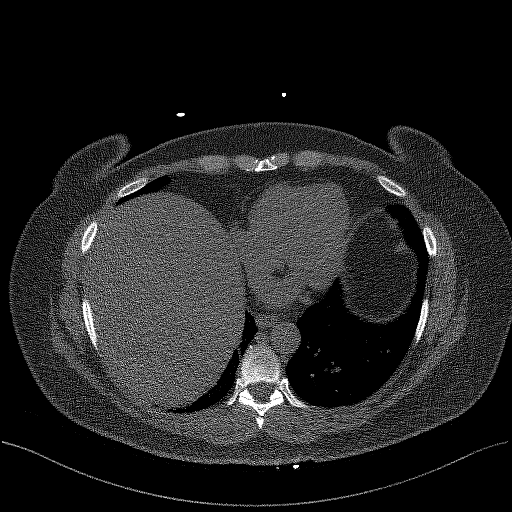
[im 38/64  vessel]
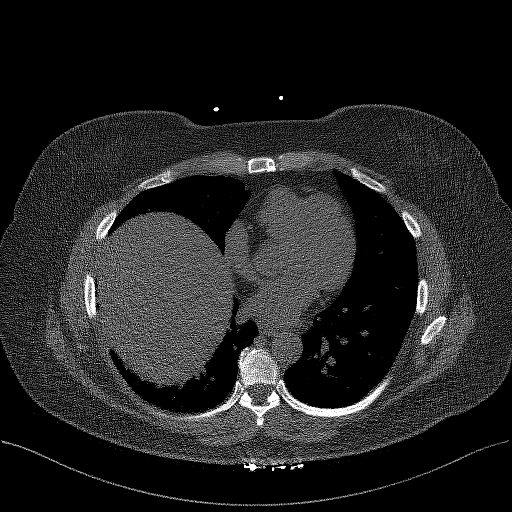
[im 51/64  vessel]
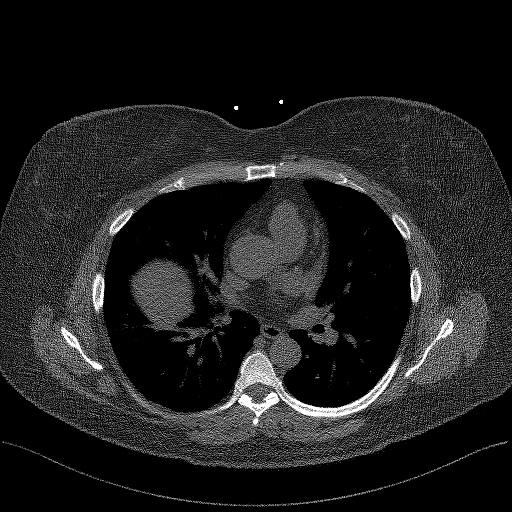

[14 of 20 positions shown; findings below may reference images not displayed]

FINDINGS: Severe elevation of the right hemidiaphragm with subsegmental
atelectasis or scarring in the right lower lobe. Within the
visualized portions of the thorax there are no suspicious appearing
pulmonary nodules or masses, there is no acute consolidative
airspace disease, no pleural effusions, no pneumothorax and no
lymphadenopathy. Visualized portions of the upper abdomen are
unremarkable. There are no aggressive appearing lytic or blastic
lesions noted in the visualized portions of the skeleton.
IMPRESSION: 1. Severe elevation of the right hemidiaphragm.
FINDINGS: Non-cardiac: See separate report from [REDACTED].

Ascending Aorta: Normal

Pericardium: Normal

Coronary arteries: Normal origin
IMPRESSION: Coronary calcium score of 0.

*** End of Addendum ***
EXAM:
OVER-READ INTERPRETATION  CT CHEST

The following report is an over-read performed by radiologist Dr.
Hosea Bloomquist [REDACTED] on 12/10/2021. This
over-read does not include interpretation of cardiac or coronary
anatomy or pathology. The coronary calcium score interpretation by
the cardiologist is attached.
FINDINGS: Severe elevation of the right hemidiaphragm with subsegmental
atelectasis or scarring in the right lower lobe. Within the
visualized portions of the thorax there are no suspicious appearing
pulmonary nodules or masses, there is no acute consolidative
airspace disease, no pleural effusions, no pneumothorax and no
lymphadenopathy. Visualized portions of the upper abdomen are
unremarkable. There are no aggressive appearing lytic or blastic
lesions noted in the visualized portions of the skeleton.
IMPRESSION: 1. Severe elevation of the right hemidiaphragm.

## 2023-05-08 ENCOUNTER — Encounter: Payer: Self-pay | Admitting: Pulmonary Disease

## 2023-05-08 ENCOUNTER — Ambulatory Visit: Payer: BC Managed Care – PPO | Admitting: Pulmonary Disease

## 2023-05-08 VITALS — BP 104/68 | HR 68 | Temp 97.4°F | Ht 65.0 in | Wt 201.0 lb

## 2023-05-08 DIAGNOSIS — M05732 Rheumatoid arthritis with rheumatoid factor of left wrist without organ or systems involvement: Secondary | ICD-10-CM

## 2023-05-08 DIAGNOSIS — J4540 Moderate persistent asthma, uncomplicated: Secondary | ICD-10-CM | POA: Diagnosis not present

## 2023-05-08 DIAGNOSIS — J849 Interstitial pulmonary disease, unspecified: Secondary | ICD-10-CM

## 2023-05-08 DIAGNOSIS — M05731 Rheumatoid arthritis with rheumatoid factor of right wrist without organ or systems involvement: Secondary | ICD-10-CM

## 2023-05-08 DIAGNOSIS — E271 Primary adrenocortical insufficiency: Secondary | ICD-10-CM | POA: Diagnosis not present

## 2023-05-08 NOTE — Patient Instructions (Signed)
Will get records from her primary care regarding the sleep study Check CBC with differential, IgE and blood test for autoimmune disease Make referral to rheumatology for rheumatoid arthritis Will give you samples of Trelegy 200.  If you like the inhaler then let us know and we can call in a prescription Schedule high-res CT at next available Schedule PFTs in 3 months Return to clinic in 3 months after PFTs

## 2023-05-08 NOTE — Progress Notes (Signed)
Renee Parker    [id number]    04/13/1966  Primary Care Physician:O'Buch, Pauline Good  Referring Physician: Eunice Blase, PA-C [address] [place],  [place] [address]  Chief complaint: Consult for dyspnea  HPI: 57 y.o. who  has a past medical history of Anxiety (06/25/2020), Arthropathy, Asthma, Depression (06/25/2020), Diabetes mellitus (HCC) (06/25/2020), DVT (deep venous thrombosis) (HCC), Dyslipidemia, GAD (generalized anxiety disorder), GERD (gastroesophageal reflux disease), Hypertension, Pulmonary emboli (HCC) (06/25/2020), Rheumatoid arthritis (HCC) (06/25/2020), and Sleep apnea.   History notable for right hemidiaphragm elevation, restrictive lung disease, PE 2021, asthma, OSA Complains of dyspnea on exertion for the past 4 months.  She has symptoms at rest and with exertion.  Has minimal cough with white mucus.  Occasional wheezing. She was given Symbicort a few months ago but had to stop due to palpitations.  Not on albuterol inhaler.  History notable for unprovoked PE in 2021 while she was living in [place] and she is maintained on Eliquis continuously with no break in therapy.  She also has a history of longstanding OSA and her primary care at [facility] recently repeated her sleep study and put her on CPAP 10 cm which does not appear to be adequate pressure as she feels suffocated.  DME company has told that she does not qualify for a new CPAP machine for unclear reasons.  Recently underwent a cardiac evaluation by Dr. Dulce Sellar with normal echocardiogram and normal CT coronaries.  Lung imaging does show right hemidiaphragm elevation with associated atelectasis.  She also has history of longstanding rheumatoid arthritis and was previously on Celebrex which was stopped.  She is currently not getting any therapy for rheumatoid arthritis and notes joint pain, morning stiffness   Pets: Occupation: Exposures: No mold, hot tub, Jacuzzi.  No feather pillows or  comforters Smoking history: Minimal smoker as a teenager Travel history: Originally from [place].  Moved to [place] in 2021 Relevant family history: No family history of lung disease   Outpatient Encounter Medications as of 05/08/2023  Medication Sig   albuterol (VENTOLIN HFA) 108 (90 Base) MCG/ACT inhaler Inhale 1-2 puffs into the lungs every 4 (four) hours as needed for shortness of breath.   b complex vitamins capsule Take 1 capsule by mouth daily.   BIOTIN PO Take 400 mg by mouth 2 (two) times daily.   budesonide-formoterol (SYMBICORT) 160-4.5 MCG/ACT inhaler Inhale 2 puffs into the lungs 2 (two) times daily.   busPIRone (BUSPAR) 15 MG tablet Take 1 tablet by mouth 2 (two) times daily.   Continuous Glucose Sensor (DEXCOM G6 SENSOR) MISC SMARTSIG:Topical Every 10 Days   Continuous Glucose Transmitter (DEXCOM G6 TRANSMITTER) MISC USE TO MONITOR BLOOD GLUCOSE AND CHANGE TRANSMITTER EVERY 3 MONTHS   ELIQUIS 5 MG TABS tablet TAKE 1 TABLET(5 MG) BY MOUTH TWICE DAILY   escitalopram (LEXAPRO) 20 MG tablet Take 20 mg by mouth daily.   fenofibrate (TRICOR) 145 MG tablet TAKE 1 TABLET(145 MG) BY MOUTH DAILY   FIASP FLEXTOUCH 100 UNIT/ML FlexTouch Pen INJECT 20 units before BEFORE MEALS,~ 60 units daily dose   Insulin Disposable Pump (OMNIPOD 5 G6 PODS, GEN 5,) MISC SMARTSIG:SUB-Q Every Other Day   omeprazole (PRILOSEC) 40 MG capsule Take 40 mg by mouth 2 (two) times daily.   OZEMPIC, 2 MG/DOSE, 8 MG/3ML SOPN Inject 2 mg into the skin once a week.   ramipril (ALTACE) 5 MG capsule Take 1 capsule (5 mg total) by mouth  daily. Needs appointment for future refills / 1st attempt   [DISCONTINUED] insulin lispro (HUMALOG) 100 UNIT/ML injection Inject 150 Units into the skin daily.   No facility-administered encounter medications on file as of 05/08/2023.    Allergies as of 05/08/2023 - Review Complete 05/08/2023  Allergen Reaction Noted   Codeine Other (See Comments) 05/08/2023   Wound dressing  adhesive Hives 05/08/2023    Past Medical History:  Diagnosis Date   Anxiety 06/25/2020   Arthropathy    Asthma    Depression 06/25/2020   Diabetes mellitus (HCC) 06/25/2020   DVT (deep venous thrombosis) (HCC)    Has been occuring in decreasing pattern for years   Dyslipidemia    GAD (generalized anxiety disorder)    GERD (gastroesophageal reflux disease)    Hypertension    Pulmonary emboli (HCC) 06/25/2020   Rheumatoid arthritis (HCC) 06/25/2020   Sleep apnea    CPAP    Past Surgical History:  Procedure Laterality Date   CHOLECYSTECTOMY  2015   HERNIA REPAIR  2021   lap hernia repair 09/2016   KNEE SURGERY Right 2015   Revision 10/2016    Family History  Problem Relation Age of Onset   Diabetes Mother    Hypertension Mother    Diabetes Father    Hypertension Father    Hyperlipidemia Father    Heart disease Father    Heart attack Father    Diabetes Maternal Grandmother    Stroke Maternal Grandmother    Breast cancer Maternal Grandmother    Ovarian cancer Maternal Grandmother    Diabetes Paternal Grandmother     Social History   Socioeconomic History   Marital status: Divorced    Spouse name: Not on file   Number of children: Not on file   Years of education: Not on file   Highest education level: Not on file  Occupational History   Not on file  Tobacco Use   Smoking status: Former    Current packs/day: 0.00    Types: Cigarettes    Start date: 1989    Quit date: 1994    Years since quitting: 30.7    Passive exposure: Past   Smokeless tobacco: Never  Vaping Use   Vaping status: Never Used  Substance and Sexual Activity   Alcohol use: Not Currently    Comment: Occasional   Drug use: Never   Sexual activity: Not Currently  Other Topics Concern   Not on file  Social History Narrative   Not on file   Social Determinants of Health   Financial Resource Strain: Not on file  Food Insecurity: Not on file  Transportation Needs: Not on file   Physical Activity: Not on file  Stress: Not on file  Social Connections: Not on file  Intimate Partner Violence: Not on file    Review of systems: Review of Systems  Constitutional: Negative for fever and chills.  HENT: Negative.   Eyes: Negative for blurred vision.  Respiratory: as per HPI  Cardiovascular: Negative for chest pain and palpitations.  Gastrointestinal: Negative for vomiting, diarrhea, blood per rectum. Genitourinary: Negative for dysuria, urgency, frequency and hematuria.  Musculoskeletal: Negative for myalgias, back pain and joint pain.  Skin: Negative for itching and rash.  Neurological: Negative for dizziness, tremors, focal weakness, seizures and loss of consciousness.  Endo/Heme/Allergies: Negative for environmental allergies.  Psychiatric/Behavioral: Negative for depression, suicidal ideas and hallucinations.  All other systems reviewed and are negative.  Physical Exam: Blood pressure 104/68, pulse 68, temperature (!)  97.4 F (36.3 C), temperature source Temporal, height 5\' 5"  (1.651 m), weight 201 lb (91.2 kg), SpO2 99%. Gen:      No acute distress HEENT:  EOMI, sclera anicteric Neck:     No masses; no thyromegaly Lungs:    Clear to auscultation bilaterally; normal respiratory effort CV:         Regular rate and rhythm; no murmurs Abd:      + bowel sounds; soft, non-tender; no palpable masses, no distension Ext:    No edema; adequate peripheral perfusion Skin:      Warm and dry; no rash Neuro: alert and oriented x 3 Psych: normal mood and affect  Data Reviewed: Imaging: CT cardiac 12/10/2021-lung images showed severe elevation of right hemidiaphragm with subsegmental atelectasis and scarring.  I have reviewed the images personally.  PFTs:  ACT score 05/08/2023- 15  Labs:  Cardiac: Echocardiogram 02/22/2023-LVEF 60 to 65%, grade 1 diastolic dysfunction, RV systolic function is normal, normal PA systolic pressure  Assessment:  Assessment for  dyspnea Likely multifactorial with asthma, reactive airway disease, right hemidiaphragm elevation causing lung restriction and remote PE.  She will also need evaluation for interstitial lung disease due to rheumatoid arthritis though recent CT coronaries looks okay.  Will check CBC with differential, IgE Give samples of Trelegy 200 inhaler as Symbicort was poorly tolerated due to palpitation.  Send albuterol rescue inhaler Order PFTs  Rheumatoid arthritis She has significant joint pain and morning stiffness.  Not currently on any treatment Would make referral to rheumatology Check ANA, rheumatoid factor, CCP Schedule high-res CT for evaluation of ILD  Plan/Recommendations: Labs Rheumatology referral Trelegy 200 High-res CT PFTs  Chilton Greathouse MD [facility] 05/08/2023, 3:46 PM  CC: O'Buch, Greta, PA-C

## 2023-05-09 ENCOUNTER — Encounter: Payer: Self-pay | Admitting: Pulmonary Disease

## 2023-05-19 ENCOUNTER — Other Ambulatory Visit: Payer: BC Managed Care – PPO

## 2023-05-19 ENCOUNTER — Ambulatory Visit
Admission: RE | Admit: 2023-05-19 | Discharge: 2023-05-19 | Disposition: A | Discharge status: Home / Self Care | Payer: BC Managed Care – PPO | Source: Ambulatory Visit | Attending: Pulmonary Disease

## 2023-05-19 DIAGNOSIS — J849 Interstitial pulmonary disease, unspecified: Secondary | ICD-10-CM

## 2023-08-08 ENCOUNTER — Ambulatory Visit: Payer: BC Managed Care – PPO | Admitting: Pulmonary Disease

## 2023-10-18 ENCOUNTER — Ambulatory Visit: Payer: BC Managed Care – PPO | Admitting: Pulmonary Disease

## 2023-12-13 ENCOUNTER — Telehealth: Payer: Self-pay

## 2023-12-13 NOTE — Telephone Encounter (Signed)
 I think her pulmonary function testing should be postponed if she is having active viral symptoms, active cough.  The test will not be as accurate if she is acutely ill.

## 2023-12-13 NOTE — Telephone Encounter (Signed)
 Copied from CRM [id number]. Topic: Clinical - Medical Advice >> Dec 11, 2023  2:42 PM Isabell A wrote: Reason for CRM: Patient calling to confirm if she should still have PFT completed on 4/17, PCP states she has whooping cough.  Spoke with patient regarding prior message . Patient stated she she has been coughing for 2 months and just recently finished antibiotics and prednisone for whooping cough . Patient has a PFT on 12/14/2023 and does patient need to r/s. Advised patient I will send this message to the DOD and once we get the message back I will contact patient back .   Dr.Byrum can you please advise   Thank you

## 2023-12-13 NOTE — Telephone Encounter (Signed)
 ATC x1. LDVM.  Advised to call back with any concerns.  PFT for 4/17 has been canceled per RB's note. NFN

## 2023-12-18 ENCOUNTER — Encounter: Payer: Self-pay | Admitting: Pulmonary Disease

## 2023-12-18 ENCOUNTER — Ambulatory Visit: Payer: BC Managed Care – PPO | Admitting: Pulmonary Disease

## 2023-12-18 ENCOUNTER — Other Ambulatory Visit (INDEPENDENT_AMBULATORY_CARE_PROVIDER_SITE_OTHER)

## 2023-12-18 VITALS — BP 127/83 | HR 86 | Ht 65.0 in | Wt 223.0 lb

## 2023-12-18 DIAGNOSIS — J4540 Moderate persistent asthma, uncomplicated: Secondary | ICD-10-CM

## 2023-12-18 DIAGNOSIS — M05731 Rheumatoid arthritis with rheumatoid factor of right wrist without organ or systems involvement: Secondary | ICD-10-CM

## 2023-12-18 DIAGNOSIS — M05732 Rheumatoid arthritis with rheumatoid factor of left wrist without organ or systems involvement: Secondary | ICD-10-CM

## 2023-12-18 DIAGNOSIS — J849 Interstitial pulmonary disease, unspecified: Secondary | ICD-10-CM | POA: Diagnosis not present

## 2023-12-18 DIAGNOSIS — E271 Primary adrenocortical insufficiency: Secondary | ICD-10-CM

## 2023-12-18 DIAGNOSIS — J45909 Unspecified asthma, uncomplicated: Secondary | ICD-10-CM | POA: Diagnosis not present

## 2023-12-18 LAB — COMPREHENSIVE METABOLIC PANEL WITH GFR
ALT: 40 U/L — ABNORMAL HIGH (ref 0–35)
AST: 37 U/L (ref 0–37)
Albumin: 4.2 g/dL (ref 3.5–5.2)
Alkaline Phosphatase: 58 U/L (ref 39–117)
BUN: 13 mg/dL (ref 6–23)
CO2: 29 meq/L (ref 19–32)
Calcium: 9.6 mg/dL (ref 8.4–10.5)
Chloride: 96 meq/L (ref 96–112)
Creatinine, Ser: 0.7 mg/dL (ref 0.40–1.20)
GFR: 95.28 mL/min (ref >60.00)
Glucose, Bld: 362 mg/dL — ABNORMAL HIGH (ref 70–99)
Potassium: 4 meq/L (ref 3.5–5.1)
Sodium: 135 meq/L (ref 135–145)
Total Bilirubin: 0.7 mg/dL (ref 0.2–1.2)
Total Protein: 7.2 g/dL (ref 6.0–8.3)

## 2023-12-18 MED ORDER — TRELEGY ELLIPTA 200-62.5-25 MCG/ACT IN AEPB: 1.0000 | INHALATION_SPRAY | Freq: Every day | RESPIRATORY_TRACT | 3 refills | Status: DC

## 2023-12-18 NOTE — Patient Instructions (Signed)
 VISIT SUMMARY:  Today, we discussed your persistent cough and breathing difficulties, which have been ongoing for six to seven weeks. You have been using Symbicort but prefer Trelegy for better symptom control. We also addressed your rheumatoid arthritis and the associated joint pain you are experiencing.  YOUR PLAN:  -ASTHMA: Asthma is a condition where your airways narrow and swell, making it difficult to breathe. You have been experiencing a recent worsening of your asthma symptoms, including a persistent cough and raspy voice. We will switch your medication back to Trelegy, as you found it more effective. We will also conduct blood tests to check your eosinophil and IgE levels and reschedule your lung function test for the next visit in 2-3 months.  -PERTUSSIS: Pertussis, also known as whooping cough, is a highly contagious respiratory tract infection. Your symptoms suggest pertussis, and the treatment you received with corticosteroids and antibiotics is appropriate. We will continue to manage this alongside your asthma treatment.  -RHEUMATOID ARTHRITIS: Rheumatoid arthritis is an autoimmune disorder that causes chronic inflammation of your joints. You are experiencing significant joint pain, especially in your knees, feet, hips, back, and neck. We will refer you to a rheumatologist for further management and order an autoimmune panel to reassess your condition.  INSTRUCTIONS:  1. Start taking Trelegy as prescribed and use the provided samples. 2. Complete the blood work to assess eosinophils and IgE levels. 3. Schedule and attend the lung function test in 2-3 months. 4. Follow up with the rheumatologist as soon as the referral is made. 5. Complete the autoimmune panel as ordered.

## 2023-12-18 NOTE — Progress Notes (Signed)
 Renee Parker    [id number]    1966/03/30  Primary Care Physician:O'Buch, Albesa Alter  Referring Physician: Clydene Darner, PA-C [address] [place],  [place] [address]  Chief complaint: Consult for dyspnea  HPI: 58 y.o. who  has a past medical history of Anxiety (06/25/2020), Arthropathy, Asthma, Depression (06/25/2020), Diabetes mellitus (HCC) (06/25/2020), DVT (deep venous thrombosis) (HCC), Dyslipidemia, GAD (generalized anxiety disorder), GERD (gastroesophageal reflux disease), Hypertension, Pulmonary emboli (HCC) (06/25/2020), Rheumatoid arthritis (HCC) (06/25/2020), and Sleep apnea.   History notable for right hemidiaphragm elevation, restrictive lung disease, PE 2021, asthma, OSA Complains of dyspnea on exertion for the past 4 months.  She has symptoms at rest and with exertion.  Has minimal cough with white mucus.  Occasional wheezing. She was given Symbicort a few months ago but had to stop due to palpitations.  Not on albuterol inhaler.  History notable for unprovoked PE in 2021 while she was living in [place]  and she is maintained on Eliquis  continuously with no break in therapy.  She also has a history of longstanding OSA and her primary care at [facility] recently repeated her sleep study and put her on CPAP 10 cm which does not appear to be adequate pressure as she feels suffocated.  DME company has told that she does not qualify for a new CPAP machine for unclear reasons.  Recently underwent a cardiac evaluation by Dr. Sandee Crook with normal echocardiogram and normal CT coronaries.  Lung imaging does show right hemidiaphragm elevation with associated atelectasis.  She also has history of longstanding rheumatoid arthritis and was previously on Celebrex which was stopped.  She is currently not getting any therapy for rheumatoid arthritis and notes joint pain, morning stiffness   Pets: Occupation: Exposures: No mold, hot tub, Jacuzzi.  No feather pillows or  comforters Smoking history: Minimal smoker as a teenager Travel history: Originally from [place] .  Moved to [place]  in 2021 Relevant family history: No family history of lung disease   Outpatient Encounter Medications as of 12/18/2023  Medication Sig   albuterol (VENTOLIN HFA) 108 (90 Base) MCG/ACT inhaler Inhale 1-2 puffs into the lungs every 4 (four) hours as needed for shortness of breath.   b complex vitamins capsule Take 1 capsule by mouth daily.   BIOTIN PO Take 400 mg by mouth 2 (two) times daily.   busPIRone (BUSPAR) 15 MG tablet Take 1 tablet by mouth 2 (two) times daily.   Continuous Glucose Sensor (DEXCOM G6 SENSOR) MISC SMARTSIG:Topical Every 10 Days   Continuous Glucose Transmitter (DEXCOM G6 TRANSMITTER) MISC USE TO MONITOR BLOOD GLUCOSE AND CHANGE TRANSMITTER EVERY 3 MONTHS   ELIQUIS  5 MG TABS tablet TAKE 1 TABLET(5 MG) BY MOUTH TWICE DAILY   escitalopram (LEXAPRO) 20 MG tablet Take 20 mg by mouth daily.   fenofibrate  (TRICOR ) 145 MG tablet TAKE 1 TABLET(145 MG) BY MOUTH DAILY   FIASP FLEXTOUCH 100 UNIT/ML FlexTouch Pen INJECT 20 units before BEFORE MEALS,~ 60 units daily dose   Fluticasone-Umeclidin-Vilant (TRELEGY ELLIPTA ) 200-62.5-25 MCG/ACT AEPB Inhale 1 puff into the lungs daily.   Insulin Disposable Pump (OMNIPOD 5 G6 PODS, GEN 5,) MISC SMARTSIG:SUB-Q Every Other Day   omeprazole (PRILOSEC) 40 MG capsule Take 40 mg by mouth 2 (two) times daily.   OZEMPIC, 2 MG/DOSE, 8 MG/3ML SOPN Inject 2 mg into the skin once a week.   ramipril  (ALTACE ) 5 MG capsule Take 1 capsule (5 mg total) by mouth daily. Needs  appointment for future refills / 1st attempt   [DISCONTINUED] budesonide-formoterol (SYMBICORT) 160-4.5 MCG/ACT inhaler Inhale 2 puffs into the lungs 2 (two) times daily.   No facility-administered encounter medications on file as of 12/18/2023.    Allergies as of 12/18/2023 - Review Complete 12/18/2023  Allergen Reaction Noted   Codeine Other (See Comments)  05/08/2023   Wound dressing adhesive Hives 05/08/2023    Past Medical History:  Diagnosis Date   Anxiety 06/25/2020   Arthropathy    Asthma    Depression 06/25/2020   Diabetes mellitus (HCC) 06/25/2020   DVT (deep venous thrombosis) (HCC)    Has been occuring in decreasing pattern for years   Dyslipidemia    GAD (generalized anxiety disorder)    GERD (gastroesophageal reflux disease)    Hypertension    Pulmonary emboli (HCC) 06/25/2020   Rheumatoid arthritis (HCC) 06/25/2020   Sleep apnea    CPAP    Past Surgical History:  Procedure Laterality Date   CHOLECYSTECTOMY  2015   HERNIA REPAIR  2021   lap hernia repair 09/2016   KNEE SURGERY Right 2015   Revision 10/2016    Family History  Problem Relation Age of Onset   Diabetes Mother    Hypertension Mother    Diabetes Father    Hypertension Father    Hyperlipidemia Father    Heart disease Father    Heart attack Father    Diabetes Maternal Grandmother    Stroke Maternal Grandmother    Breast cancer Maternal Grandmother    Ovarian cancer Maternal Grandmother    Diabetes Paternal Grandmother     Social History   Socioeconomic History   Marital status: Divorced    Spouse name: Not on file   Number of children: Not on file   Years of education: Not on file   Highest education level: Not on file  Occupational History   Not on file  Tobacco Use   Smoking status: Former    Current packs/day: 0.00    Types: Cigarettes    Start date: 1989    Quit date: 1994    Years since quitting: 31.3    Passive exposure: Past   Smokeless tobacco: Never  Vaping Use   Vaping status: Never Used  Substance and Sexual Activity   Alcohol use: Not Currently    Comment: Occasional   Drug use: Never   Sexual activity: Not Currently  Other Topics Concern   Not on file  Social History Narrative   Not on file   Social Drivers of Health   Financial Resource Strain: Not on file  Food Insecurity: Not on file  Transportation  Needs: Not on file  Physical Activity: Not on file  Stress: Not on file  Social Connections: Not on file  Intimate Partner Violence: Not on file    Review of systems: Review of Systems  Constitutional: Negative for fever and chills.  HENT: Negative.   Eyes: Negative for blurred vision.  Respiratory: as per HPI  Cardiovascular: Negative for chest pain and palpitations.  Gastrointestinal: Negative for vomiting, diarrhea, blood per rectum. Genitourinary: Negative for dysuria, urgency, frequency and hematuria.  Musculoskeletal: Negative for myalgias, back pain and joint pain.  Skin: Negative for itching and rash.  Neurological: Negative for dizziness, tremors, focal weakness, seizures and loss of consciousness.  Endo/Heme/Allergies: Negative for environmental allergies.  Psychiatric/Behavioral: Negative for depression, suicidal ideas and hallucinations.  All other systems reviewed and are negative.  Physical Exam: Blood pressure 104/68, pulse 68, temperature (!)  97.4 F (36.3 C), temperature source Temporal, height 5\' 5"  (1.651 m), weight 201 lb (91.2 kg), SpO2 99%. Gen:      No acute distress HEENT:  EOMI, sclera anicteric Neck:     No masses; no thyromegaly Lungs:    Clear to auscultation bilaterally; normal respiratory effort CV:         Regular rate and rhythm; no murmurs Abd:      + bowel sounds; soft, non-tender; no palpable masses, no distension Ext:    No edema; adequate peripheral perfusion Skin:      Warm and dry; no rash Neuro: alert and oriented x 3 Psych: normal mood and affect  Data Reviewed: Imaging: CT cardiac 12/10/2021-lung images showed severe elevation of right hemidiaphragm with subsegmental atelectasis and scarring.    High resolution CT 06/02/2023-No evidence of interstitial lung disease, mild air trapping, punctate coronary calcification, elevated right hemidiaphragm, steatotic liver.  I have reviewed the images personally.  PFTs:  ACT score 05/08/2023-  15  Labs:  Cardiac: Echocardiogram 02/22/2023-LVEF 60 to 65%, grade 1 diastolic dysfunction, RV systolic function is normal, normal PA systolic pressure  Assessment:  Asthma Chronic asthma with recent exacerbation, presenting as dyspnea, persistent cough, and dysphonia. Previously treated with prednisone and azithromycin. Currently using Symbicort with improvement, but prefers Trelegy for superior symptom control. No wheezing present. Differential includes pertussis, but management remains with corticosteroids and antibiotics. Decision to switch to Trelegy is based on her report of better symptom control. - Prescribe Trelegy and provide samples - Order blood work to assess eosinophils and IgE levels - Reschedule lung function test for next visit in 2-3 months   Rheumatoid arthritis Chronic rheumatoid arthritis with significant arthralgia affecting knees, feet, hips, back, and neck. No current treatment. Previous rheumatology referral not completed. No interstitial lung disease on prior CT scan. Elevated right diaphragm noted, possibly contributing to dyspnea. Referral to rheumatology is necessary for further management. - Make referral to rheumatology - Order autoimmune panel to reassess rheumatoid arthritis status   Steatotic liver Noted on previous CT scan Discussed with patient and asked her to follow-up with primary care Order metabolic panel to check liver function  Plan/Recommendations: Labs Rheumatology referral Trelegy 200 she will need labs PFTs  Phyllis Breeze MD [facility] 12/18/2023, 1:47 PM  CC: O'Buch, Greta, PA-C

## 2023-12-18 NOTE — Addendum Note (Signed)
 Addended by: KIJANIA-SWARINGEN, Genoveva Singleton R on: 12/18/2023 02:20 PM   Modules accepted: Orders

## 2024-01-04 ENCOUNTER — Encounter: Payer: Self-pay | Admitting: Pulmonary Disease

## 2024-03-24 ENCOUNTER — Other Ambulatory Visit: Payer: Self-pay | Admitting: Cardiology

## 2024-03-24 DIAGNOSIS — I2693 Single subsegmental pulmonary embolism without acute cor pulmonale: Secondary | ICD-10-CM

## 2024-03-25 NOTE — Telephone Encounter (Signed)
 Prescription refill request for Eliquis  received. Indication:pe Last office visit:needs appt Scr:na Age: 58 Weight:101.2  kg  Prescription refilled

## 2024-04-26 NOTE — Progress Notes (Deleted)
  Cardiology Office Note   Date:  04/29/2024  ID:  Renee Parker, DOB [birth year], MRN [id number] PCP: Venancio Pock, PA-C  [facility] Providers Cardiologist:  Redell Leiter, MD { Click to update primary MD,subspecialty MD or APP then REFRESH:1}    History of Present Illness Renee Parker is a 57 y.o. female with a past medical history of DVT/PE in 2021, coronary artery calcifications noted on CT imaging, asthma, OSA, DM 2, hypertension, dyslipidemia.  05/19/2023 CT of the chest punctate calcification of the coronary arteries 02/22/2023 echo EF 60 to 65%, grade 1 DD, no valvular abnormalities 12/15/2021 calcium score of 0, severe elevation of the right hemidiaphragm  In 2021 she experienced a pulmonary embolism, had a CT of her chest revealing a clot in her right lower lobe, treated with IVC filter and anticoagulation.  She established care with Dr. Leiter in 2023 at the best of her PCP for evaluation of DOE and claudication.  She underwent a calcium score which was 0 however she did have a severely elevation of the right hemidiaphragm.  Echocardiogram at that time was overall normal.  She had a CT of her chest in 2024 revealing punctate calcifications of the coronary arteries.  Most recently she was evaluated by Dr. Leiter on 01/24/2023, she had been referred to pulmonology for her ongoing shortness of breath, plans to follow back up in 1 year. ROS: ***  Studies Reviewed      *** Risk Assessment/Calculations {Does this patient have ATRIAL FIBRILLATION?:[phone]} No BP recorded.  {Refresh Note OR Click here to enter BP  :1}***       Physical Exam VS:  There were no vitals taken for this visit.       Wt Readings from Last 3 Encounters:  12/18/23 223 lb (101.2 kg)  05/08/23 201 lb (91.2 kg)  01/24/23 218 lb 6.4 oz (99.1 kg)    GEN: Well nourished, well developed in no acute distress NECK: No JVD; No carotid bruits CARDIAC: ***RRR, no murmurs, rubs, gallops RESPIRATORY:  Clear to  auscultation without rales, wheezing or rhonchi  ABDOMEN: Soft, non-tender, non-distended EXTREMITIES:  No edema; No deformity   ASSESSMENT AND PLAN Coronary artery calcifications noted on CT imaging -      {Are you ordering a CV Procedure (e.g. stress test, cath, DCCV, TEE, etc)?   Press F2        :[id number]}  Dispo: ***  Signed, Delon JAYSON Hoover, NP

## 2024-04-30 ENCOUNTER — Ambulatory Visit: Admitting: Cardiology

## 2024-04-30 DIAGNOSIS — Z7901 Long term (current) use of anticoagulants: Secondary | ICD-10-CM

## 2024-04-30 DIAGNOSIS — R0602 Shortness of breath: Secondary | ICD-10-CM

## 2024-04-30 DIAGNOSIS — I10 Essential (primary) hypertension: Secondary | ICD-10-CM

## 2024-04-30 DIAGNOSIS — I2510 Atherosclerotic heart disease of native coronary artery without angina pectoris: Secondary | ICD-10-CM

## 2024-05-03 ENCOUNTER — Other Ambulatory Visit: Payer: Self-pay | Admitting: *Deleted

## 2024-05-03 DIAGNOSIS — J4540 Moderate persistent asthma, uncomplicated: Secondary | ICD-10-CM

## 2024-05-03 DIAGNOSIS — J849 Interstitial pulmonary disease, unspecified: Secondary | ICD-10-CM

## 2024-05-10 ENCOUNTER — Telehealth (INDEPENDENT_AMBULATORY_CARE_PROVIDER_SITE_OTHER): Admitting: Adult Health

## 2024-05-10 ENCOUNTER — Ambulatory Visit: Admitting: Pulmonary Disease

## 2024-05-10 ENCOUNTER — Encounter: Payer: Self-pay | Admitting: Adult Health

## 2024-05-10 DIAGNOSIS — Z7901 Long term (current) use of anticoagulants: Secondary | ICD-10-CM

## 2024-05-10 DIAGNOSIS — I2693 Single subsegmental pulmonary embolism without acute cor pulmonale: Secondary | ICD-10-CM

## 2024-05-10 DIAGNOSIS — Z86711 Personal history of pulmonary embolism: Secondary | ICD-10-CM | POA: Diagnosis not present

## 2024-05-10 DIAGNOSIS — G4733 Obstructive sleep apnea (adult) (pediatric): Secondary | ICD-10-CM

## 2024-05-10 DIAGNOSIS — J849 Interstitial pulmonary disease, unspecified: Secondary | ICD-10-CM

## 2024-05-10 DIAGNOSIS — J4540 Moderate persistent asthma, uncomplicated: Secondary | ICD-10-CM

## 2024-05-10 DIAGNOSIS — G4730 Sleep apnea, unspecified: Secondary | ICD-10-CM

## 2024-05-10 DIAGNOSIS — Z8739 Personal history of other diseases of the musculoskeletal system and connective tissue: Secondary | ICD-10-CM

## 2024-05-10 DIAGNOSIS — M05731 Rheumatoid arthritis with rheumatoid factor of right wrist without organ or systems involvement: Secondary | ICD-10-CM

## 2024-05-10 DIAGNOSIS — Z87891 Personal history of nicotine dependence: Secondary | ICD-10-CM

## 2024-05-10 LAB — PULMONARY FUNCTION TEST
DL/VA % pred: 137 %
DL/VA: 5.82 ml/min/mmHg/L
DLCO cor % pred: 108 %
DLCO cor: 22.41 ml/min/mmHg
DLCO unc % pred: 108 %
DLCO unc: 22.41 ml/min/mmHg
FEF 25-75 Post: 3.09 L/s
FEF 25-75 Pre: 2.28 L/s
FEF2575-%Change-Post: 35 %
FEF2575-%Pred-Post: 125 %
FEF2575-%Pred-Pre: 92 %
FEV1-%Change-Post: 8 %
FEV1-%Pred-Post: 80 %
FEV1-%Pred-Pre: 74 %
FEV1-Post: 2.15 L
FEV1-Pre: 1.97 L
FEV1FVC-%Change-Post: 3 %
FEV1FVC-%Pred-Pre: 105 %
FEV6-%Change-Post: 5 %
FEV6-%Pred-Post: 75 %
FEV6-%Pred-Pre: 71 %
FEV6-Post: 2.49 L
FEV6-Pre: 2.37 L
FEV6FVC-%Pred-Post: 103 %
FEV6FVC-%Pred-Pre: 103 %
FVC-%Change-Post: 5 %
FVC-%Pred-Post: 72 %
FVC-%Pred-Pre: 69 %
FVC-Post: 2.49 L
FVC-Pre: 2.37 L
Post FEV1/FVC ratio: 86 %
Post FEV6/FVC ratio: 100 %
Pre FEV1/FVC ratio: 83 %
Pre FEV6/FVC Ratio: 100 %
RV % pred: 58 %
RV: 1.15 L
TLC % pred: 70 %
TLC: 3.59 L

## 2024-05-10 MED ORDER — TRELEGY ELLIPTA 200-62.5-25 MCG/ACT IN AEPB: 1.0000 | INHALATION_SPRAY | Freq: Every day | RESPIRATORY_TRACT | 5 refills | Status: AC

## 2024-05-10 MED ORDER — ALBUTEROL SULFATE HFA 108 (90 BASE) MCG/ACT IN AERS: 1.0000 | INHALATION_SPRAY | RESPIRATORY_TRACT | 2 refills | Status: AC | PRN

## 2024-05-10 NOTE — Progress Notes (Signed)
 Full PFT performed today.

## 2024-05-10 NOTE — Patient Instructions (Addendum)
 Continue on Trelegy 1 puff daily, rinse after use.  Albuterol  inhaler As needed   Follow up with Dr. Theophilus in 6 months and As needed

## 2024-05-10 NOTE — Patient Instructions (Signed)
 Full PFT performed today.

## 2024-05-10 NOTE — Progress Notes (Signed)
 Virtual Visit via Video Note  I connected with Renee Parker on 05/10/24 at  1:30 PM EDT by a video enabled telemedicine application and verified that I am speaking with the correct person using two identifiers.  Location: Patient: Home  Provider: Office    I discussed the limitations of evaluation and management by telemedicine and the availability of in person appointments. The patient expressed understanding and agreed to proceed.  History of Present Illness: 58 year old female followed for Asthma, restrictive lung disease with a right hemidiaphragm elevation, unprovoked PE in 2021 while living in [place]  maintained on lifelong anticoagulation therapy and sleep apnea-CPAP intolerance Today's video visit is a 8-month follow-up for asthma and review of pulmonary function testing.  Discussed the use of AI scribe software for clinical note transcription with the patient, who gave verbal consent to proceed.  History of Present Illness Renee Parker is a 58 year old female with asthma who presents for a six-month follow-up.  She underwent a pulmonary function testing that showed mild restriction with an FEV1 at 80%, ratio 86, FVC 72%, positive mid flow reversibility, DLCO 108%. She has not smoked in over thirty years.  Last visit patient was changed from Symbicort to Trelegy .  Lab work was recommended but unfortunately not completed.  She is currently on Trelegy, which she feels controls her asthma well. She almost never uses her albuterol  rescue inhaler due to palpitations but prefers to keep it for emergencies.  She has a history of rheumatoid arthritis and was supposed to follow up with a rheumatologist.  Patient needed a referral for rheumatology last visit but unfortunately was unreachable.  A new referral has been placed per her request.  Of note patient is on ACE inhibitor.      Observations/Objective: CT cardiac 12/10/2021-lung images showed severe elevation of right hemidiaphragm with  subsegmental atelectasis and scarring.     High resolution CT 06/02/2023-No evidence of interstitial lung disease, mild air trapping, punctate coronary calcification, elevated right hemidiaphragm, steatotic liver.  Assessment and Plan: Moderate persistent asthma-improved control on Trelegy.  Continue on current regimen.  Oral inhaler care discussed albuterol  as needed.  Asthma action plan discussed.  Patient declines flu vaccine.  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  Albuterol  inhaler As needed   Follow up with Dr. Theophilus in 6 months and As needed        Follow Up Instructions:    I discussed the assessment and treatment plan with the patient. The patient was provided an opportunity to ask questions and all were answered. The patient agreed with the plan and demonstrated an understanding of the instructions.   The patient was advised to call back or seek an in-person evaluation if the symptoms worsen or if the condition fails to improve as anticipated.  I provided 20 minutes of non-face-to-face time during this encounter.   Madelin Stank, NP

## 2024-05-13 ENCOUNTER — Ambulatory Visit: Admitting: Adult Health

## 2024-05-29 ENCOUNTER — Other Ambulatory Visit: Payer: Self-pay

## 2024-05-30 ENCOUNTER — Ambulatory Visit: Attending: Cardiology | Admitting: Cardiology

## 2024-05-30 NOTE — Progress Notes (Deleted)
 Cardiology Office Note:    Date:  05/30/2024   ID:  Renee Parker, DOB 12/28/1965, MRN [id number]  PCP:  Venancio Pock, PA-C  Cardiologist:  Redell Leiter, MD    Referring MD: Venancio Pock, PA-C    ASSESSMENT:    No diagnosis found. PLAN:    In order of problems listed above:  ***   Next appointment: ***   Medication Adjustments/Labs and Tests Ordered: Current medicines are reviewed at length with the patient today.  Concerns regarding medicines are outlined above.  No orders of the defined types were placed in this encounter.  No orders of the defined types were placed in this encounter.    History of Present Illness:    Renee Parker is a 58 y.o. female with a hx of coronary artery calcium score of 0 previous DVT and pulmonary embolism 2021 asthma sleep apnea type 2 diabetes hypertension hyperlipidemia and elevation of the right hemidiaphragm on CT scan last seen 01/24/2023.  She has persistent shortness of breath attributed to pulmonary embolism and elevation hemidiaphragm, her N-terminal proBNP level is quite low 49 and the rule out heart failure range.  Her echocardiogram after the last visit did not show findings of systolic or diastolic heart failure or pulmonary artery hypertension.  He has been seen by pulmonary for restrictive lung disease and asthma and prescribed bronchodilators.  She is also noted to have hepatic steatosis on CT. Compliance with diet, lifestyle and medications: *** Past Medical History:  Diagnosis Date   Anxiety 06/25/2020   Arthropathy    Asthma    Depression 06/25/2020   Diabetes mellitus (HCC) 06/25/2020   DVT (deep venous thrombosis) (HCC)    Has been occuring in decreasing pattern for years   Dyslipidemia    GAD (generalized anxiety disorder)    GERD (gastroesophageal reflux disease)    Hypertension    Pulmonary emboli (HCC) 06/25/2020   Rheumatoid arthritis (HCC) 06/25/2020   Sleep apnea    CPAP    Current Medications: No  outpatient medications have been marked as taking for the 05/30/24 encounter (Appointment) with Leiter Redell PARAS, MD.      EKGs/Labs/Other Studies Reviewed:    The following studies were reviewed today:  Cardiac Studies & Procedures   ______________________________________________________________________________________________     ECHOCARDIOGRAM  ECHOCARDIOGRAM COMPLETE 02/22/2023  Narrative ECHOCARDIOGRAM REPORT    Patient Name:   Renee Parker Date of Exam: 02/22/2023 Medical Rec #:  [id number]  Height:       65.0 in Accession #:    [id number] Weight:       218.4 lb Date of Birth:  01-Jul-1966 BSA:          2.054 m Patient Age:    56 years   BP:           128/76 mmHg Patient Gender: F          HR:           77 bpm. Exam Location:  [facility]  Procedure: 2D Echo, Cardiac Doppler, Color Doppler and Strain Analysis  Indications:    Primary hypertension [I10 (ICD-10-CM)]; Chronic anticoagulation [Z79.01 (ICD-10-CM)]; SOB (shortness of breath) [R06.02 (ICD-10-CM)]  History:        Patient has no prior history of Echocardiogram examinations.  Sonographer:    Lynwood Silvas RDCS Referring Phys: [id number] Shady Bradish J Graeden Bitner  IMPRESSIONS   1. Left ventricular ejection fraction, by estimation, is 60 to 65%. The left ventricle has normal function. The left ventricle has no regional  wall motion abnormalities. Left ventricular diastolic parameters are consistent with Grade I diastolic dysfunction (impaired relaxation). GLS-17.6% 2. Right ventricular systolic function is normal. The right ventricular size is normal. There is normal pulmonary artery systolic pressure. 3. The mitral valve is normal in structure. No evidence of mitral valve regurgitation. No evidence of mitral stenosis. 4. The aortic valve is normal in structure. Aortic valve regurgitation is not visualized. No aortic stenosis is present. 5. The inferior vena cava is normal in size with greater than 50% respiratory variability,  suggesting right atrial pressure of 3 mmHg.  FINDINGS Left Ventricle: Left ventricular ejection fraction, by estimation, is 60 to 65%. The left ventricle has normal function. The left ventricle has no regional wall motion abnormalities. The left ventricular internal cavity size was normal in size. There is no left ventricular hypertrophy. Left ventricular diastolic parameters are consistent with Grade I diastolic dysfunction (impaired relaxation).  Right Ventricle: The right ventricular size is normal. No increase in right ventricular wall thickness. Right ventricular systolic function is normal. There is normal pulmonary artery systolic pressure. The tricuspid regurgitant velocity is 1.59 m/s, and with an assumed right atrial pressure of 3 mmHg, the estimated right ventricular systolic pressure is 13.1 mmHg.  Left Atrium: Left atrial size was normal in size.  Right Atrium: Right atrial size was normal in size.  Pericardium: There is no evidence of pericardial effusion.  Mitral Valve: The mitral valve is normal in structure. No evidence of mitral valve regurgitation. No evidence of mitral valve stenosis.  Tricuspid Valve: The tricuspid valve is normal in structure. Tricuspid valve regurgitation is not demonstrated. No evidence of tricuspid stenosis.  Aortic Valve: The aortic valve is normal in structure. Aortic valve regurgitation is not visualized. No aortic stenosis is present.  Pulmonic Valve: The pulmonic valve was normal in structure. Pulmonic valve regurgitation is not visualized. No evidence of pulmonic stenosis.  Aorta: The aortic root is normal in size and structure.  Venous: The inferior vena cava is normal in size with greater than 50% respiratory variability, suggesting right atrial pressure of 3 mmHg.  IAS/Shunts: No atrial level shunt detected by color flow Doppler.   LEFT VENTRICLE PLAX 2D LVIDd:         4.80 cm   Diastology LVIDs:         3.30 cm   LV e' medial:     8.16 cm/s LV PW:         1.00 cm   LV E/e' medial:  9.1 LV IVS:        1.00 cm   LV e' lateral:   11.30 cm/s LVOT diam:     2.00 cm   LV E/e' lateral: 6.6 LV SV:         77 LV SV Index:   38 LVOT Area:     3.14 cm   RIGHT VENTRICLE             IVC RV Basal diam:  2.70 cm     IVC diam: 1.30 cm RV S prime:     10.10 cm/s TAPSE (M-mode): 2.1 cm  LEFT ATRIUM             Index        RIGHT ATRIUM           Index LA diam:        3.50 cm 1.70 cm/m   RA Area:     11.50 cm LA Vol (A2C):   53.6 ml  26.10 ml/m  RA Volume:   24.90 ml  12.13 ml/m LA Vol (A4C):   39.1 ml 19.04 ml/m LA Biplane Vol: 45.3 ml 22.06 ml/m AORTIC VALVE LVOT Vmax:   121.00 cm/s LVOT Vmean:  82.050 cm/s LVOT VTI:    0.246 m  AORTA Ao Root diam: 3.30 cm Ao Asc diam:  3.40 cm Ao Desc diam: 2.40 cm  MV E velocity: 74.40 cm/s  TRICUSPID VALVE MV A velocity: 74.40 cm/s  TR Peak grad:   10.1 mmHg MV E/A ratio:  1.00        TR Vmax:        159.00 cm/s  SHUNTS Systemic VTI:  0.25 m Systemic Diam: 2.00 cm  Jennifer Crape MD Electronically signed by Jennifer Crape MD Signature Date/Time: 02/22/2023/12:14:17 PM    Final      CT SCANS  CT CARDIAC SCORING (SELF PAY ONLY) 12/10/2021  Addendum 12/15/2021  9:51 AM ADDENDUM REPORT: 12/15/2021 09:49  CLINICAL DATA:  Risk stratification  EXAM: Coronary Calcium Score  TECHNIQUE: The patient was scanned on a [employer] scanner. Axial non-contrast 3 mm slices were carried out through the heart. The data set was analyzed on a dedicated work station and scored using the Agatson method.  FINDINGS: Non-cardiac: See separate report from [facility].  Ascending Aorta: Normal  Pericardium: Normal  Coronary arteries: Normal origin  IMPRESSION: Coronary calcium score of 0.   Electronically Signed By: Lamar Fitch M.D. On: 12/15/2021 09:49  Narrative EXAM: OVER-READ INTERPRETATION  CT CHEST  The following report is an over-read  performed by radiologist Dr. Toribio Aye of [facility] on 12/10/2021. This over-read does not include interpretation of cardiac or coronary anatomy or pathology. The coronary calcium score interpretation by the cardiologist is attached.  COMPARISON:  None.  FINDINGS: Severe elevation of the right hemidiaphragm with subsegmental atelectasis or scarring in the right lower lobe. Within the visualized portions of the thorax there are no suspicious appearing pulmonary nodules or masses, there is no acute consolidative airspace disease, no pleural effusions, no pneumothorax and no lymphadenopathy. Visualized portions of the upper abdomen are unremarkable. There are no aggressive appearing lytic or blastic lesions noted in the visualized portions of the skeleton.  IMPRESSION: 1. Severe elevation of the right hemidiaphragm.  Electronically Signed: By: Toribio Aye M.D. On: 12/10/2021 10:36     ______________________________________________________________________________________________          Recent Labs: 12/18/2023: ALT 40; BUN 13; Creatinine, Ser 0.70; Potassium 4.0; Sodium 135  Recent Lipid Panel No results found for: CHOL, TRIG, HDL, CHOLHDL, VLDL, LDLCALC, LDLDIRECT  Physical Exam:    VS:  There were no vitals taken for this visit.    Wt Readings from Last 3 Encounters:  12/18/23 223 lb (101.2 kg)  05/08/23 201 lb (91.2 kg)  01/24/23 218 lb 6.4 oz (99.1 kg)     GEN: *** Well nourished, well developed in no acute distress HEENT: Normal NECK: No JVD; No carotid bruits LYMPHATICS: No lymphadenopathy CARDIAC: ***RRR, no murmurs, rubs, gallops RESPIRATORY:  Clear to auscultation without rales, wheezing or rhonchi  ABDOMEN: Soft, non-tender, non-distended MUSCULOSKELETAL:  No edema; No deformity  SKIN: Warm and dry NEUROLOGIC:  Alert and oriented x 3 PSYCHIATRIC:  Normal affect    Signed, Redell Leiter, MD  05/30/2024 9:01 AM     [facility]

## 2024-09-03 DIAGNOSIS — G894 Chronic pain syndrome: Secondary | ICD-10-CM

## 2024-09-11 ENCOUNTER — Telehealth: Payer: Self-pay | Admitting: Cardiology

## 2024-09-11 ENCOUNTER — Other Ambulatory Visit (HOSPITAL_BASED_OUTPATIENT_CLINIC_OR_DEPARTMENT_OTHER): Payer: Self-pay | Admitting: Pain Medicine

## 2024-09-11 ENCOUNTER — Other Ambulatory Visit: Payer: Self-pay | Admitting: Cardiology

## 2024-09-11 ENCOUNTER — Telehealth: Payer: Self-pay

## 2024-09-11 DIAGNOSIS — I2693 Single subsegmental pulmonary embolism without acute cor pulmonale: Secondary | ICD-10-CM

## 2024-09-11 DIAGNOSIS — G894 Chronic pain syndrome: Secondary | ICD-10-CM

## 2024-09-11 NOTE — Telephone Encounter (Signed)
 Called the patient and she reported that over the past month she has developed SOB with exertion. She states that she becomes out of breath when talking and with exertion such as walking fast. She also reported that when she gets out of a chair she gets light headed. She recently started a new pain patch that was prescribed by her pain management doctor. I encouraged her when she gets up to wait for a minute until she isn't light headed before starting to walk. Please advise.

## 2024-09-11 NOTE — Telephone Encounter (Signed)
 Pt c/o Shortness Of Breath: STAT if SOB developed within the last 24 hours or pt is noticeably SOB on the phone  1. Are you currently SOB (can you hear that pt is SOB on the phone)? No  2. How long have you been experiencing SOB? 1 month  3. Are you SOB when sitting or when up moving around? Moving around   4. Are you currently experiencing any other symptoms? Lightheadedness

## 2024-09-12 ENCOUNTER — Telehealth: Payer: Self-pay

## 2024-09-12 ENCOUNTER — Ambulatory Visit (INDEPENDENT_AMBULATORY_CARE_PROVIDER_SITE_OTHER)
Admission: RE | Admit: 2024-09-12 | Discharge: 2024-09-12 | Disposition: A | Discharge status: Home / Self Care | Source: Ambulatory Visit | Attending: Pain Medicine | Admitting: Pain Medicine

## 2024-09-12 ENCOUNTER — Ambulatory Visit (HOSPITAL_BASED_OUTPATIENT_CLINIC_OR_DEPARTMENT_OTHER)
Admission: RE | Admit: 2024-09-12 | Discharge: 2024-09-12 | Disposition: A | Source: Ambulatory Visit | Attending: Pain Medicine | Admitting: Pain Medicine

## 2024-09-12 DIAGNOSIS — G894 Chronic pain syndrome: Secondary | ICD-10-CM | POA: Diagnosis not present

## 2024-09-12 NOTE — Telephone Encounter (Signed)
 Renee Parker

## 2024-09-12 NOTE — Telephone Encounter (Signed)
 Called the patient and informed her of Dr. Madireddy's recommendation below:  She should be scheduled for office visit with Dr. Monetta or any other available provider. Encourage her to also discuss symptoms with her PCP and pulmonologist. Thank you  Patient verbalized understanding and an appointment was scheduled for the patient to see Dr. Monetta at 1:40 pm on 09/18/24. Patient was made aware of the appointment and had no further questions at this time.

## 2024-09-12 NOTE — Telephone Encounter (Signed)
 SABRA

## 2024-09-16 NOTE — Progress Notes (Unsigned)
 " Cardiology Office Note:    Date:  09/16/2024   ID:  Renee Parker, DOB 1966-03-03, MRN [id number]  PCP:  Venancio Pock, PA-C  Cardiologist:  Redell Leiter, MD    Referring MD: Venancio Pock, PA-C    ASSESSMENT:    No diagnosis found. PLAN:    In order of problems listed above:  ***   Next appointment: ***   Medication Adjustments/Labs and Tests Ordered: Current medicines are reviewed at length with the patient today.  Concerns regarding medicines are outlined above.  No orders of the defined types were placed in this encounter.  No orders of the defined types were placed in this encounter.    History of Present Illness:    Renee Parker is a 59 y.o. female with a hx of previous DVT and pulmonary embolism with ongoing chronic shortness of breath elevated right hemidiaphragm obstructive sleep apnea as well as hypertension.  Last seen 01/24/2024.  She had a virtual visit with pulmonary in September with moderate persistent asthma sleep apnea CPAP intolerant and previous unprovoked pulmonary embolism on lifetime anticoagulation she was continued on bronchodilators.  She had a coronary calcium score performed 12/10/2021 showing a coronary artery calcium score of 0 was not felt to benefit from an ischemia evaluation over read of the CT scan showed severe elevation of the right hemidiaphragm with atelectasis of the right lower lobe.  Duplex lower extremity 11/15/2021 showing no findings of thrombophlebitis.  She had a chest x-ray 10/03/2023 showing elevated right hemidiaphragm no other abnormality she had an EKG the same day at [facility] showing sinus rhythm and it was normal independently reviewed by me.  She was seen that day in the ED with flulike symptoms hemoglobin 14.4 platelets 209,000 creatinine 0.5 BNP level was low at 86 rule out heart failure range for discharge diagnosis was asthma. Compliance with diet, lifestyle and medications: *** Past Medical History:  Diagnosis  Date   Anxiety 06/25/2020   Arthropathy    Asthma    Cervical radiculopathy 07/30/2018   Depression 06/25/2020   Diabetes mellitus (HCC) 06/25/2020   DVT (deep venous thrombosis) (HCC)    Has been occuring in decreasing pattern for years   Dyslipidemia    GAD (generalized anxiety disorder)    GERD (gastroesophageal reflux disease)    Hypertension    Obesity 07/30/2018   Pulmonary emboli (HCC) 06/25/2020   Rheumatoid arthritis (HCC) 06/25/2020   Sleep apnea    CPAP    Current Medications: Active Medications[1]    EKGs/Labs/Other Studies Reviewed:    The following studies were reviewed today:  Cardiac Studies & Procedures   ______________________________________________________________________________________________     ECHOCARDIOGRAM  ECHOCARDIOGRAM COMPLETE 02/22/2023  Narrative ECHOCARDIOGRAM REPORT    Patient Name:   Renee Parker Date of Exam: 02/22/2023 Medical Rec #:  [id number]  Height:       65.0 in Accession #:    [id number] Weight:       218.4 lb Date of Birth:  November 19, 1965 BSA:          2.054 m Patient Age:    56 years   BP:           128/76 mmHg Patient Gender: F          HR:           77 bpm. Exam Location:  [facility]  Procedure: 2D Echo, Cardiac Doppler, Color Doppler and Strain Analysis  Indications:    Primary hypertension [I10 (ICD-10-CM)]; Chronic anticoagulation [Z79.01 (ICD-10-CM)];  SOB (shortness of breath) [R06.02 (ICD-10-CM)]  History:        Patient has no prior history of Echocardiogram examinations.  Sonographer:    Lynwood Silvas RDCS Referring Phys: [id number] Arrick Dutton J Addie Alonge  IMPRESSIONS   1. Left ventricular ejection fraction, by estimation, is 60 to 65%. The left ventricle has normal function. The left ventricle has no regional wall motion abnormalities. Left ventricular diastolic parameters are consistent with Grade I diastolic dysfunction (impaired relaxation). GLS-17.6% 2. Right ventricular systolic function is normal. The right  ventricular size is normal. There is normal pulmonary artery systolic pressure. 3. The mitral valve is normal in structure. No evidence of mitral valve regurgitation. No evidence of mitral stenosis. 4. The aortic valve is normal in structure. Aortic valve regurgitation is not visualized. No aortic stenosis is present. 5. The inferior vena cava is normal in size with greater than 50% respiratory variability, suggesting right atrial pressure of 3 mmHg.  FINDINGS Left Ventricle: Left ventricular ejection fraction, by estimation, is 60 to 65%. The left ventricle has normal function. The left ventricle has no regional wall motion abnormalities. The left ventricular internal cavity size was normal in size. There is no left ventricular hypertrophy. Left ventricular diastolic parameters are consistent with Grade I diastolic dysfunction (impaired relaxation).  Right Ventricle: The right ventricular size is normal. No increase in right ventricular wall thickness. Right ventricular systolic function is normal. There is normal pulmonary artery systolic pressure. The tricuspid regurgitant velocity is 1.59 m/s, and with an assumed right atrial pressure of 3 mmHg, the estimated right ventricular systolic pressure is 13.1 mmHg.  Left Atrium: Left atrial size was normal in size.  Right Atrium: Right atrial size was normal in size.  Pericardium: There is no evidence of pericardial effusion.  Mitral Valve: The mitral valve is normal in structure. No evidence of mitral valve regurgitation. No evidence of mitral valve stenosis.  Tricuspid Valve: The tricuspid valve is normal in structure. Tricuspid valve regurgitation is not demonstrated. No evidence of tricuspid stenosis.  Aortic Valve: The aortic valve is normal in structure. Aortic valve regurgitation is not visualized. No aortic stenosis is present.  Pulmonic Valve: The pulmonic valve was normal in structure. Pulmonic valve regurgitation is not visualized. No  evidence of pulmonic stenosis.  Aorta: The aortic root is normal in size and structure.  Venous: The inferior vena cava is normal in size with greater than 50% respiratory variability, suggesting right atrial pressure of 3 mmHg.  IAS/Shunts: No atrial level shunt detected by color flow Doppler.   LEFT VENTRICLE PLAX 2D LVIDd:         4.80 cm   Diastology LVIDs:         3.30 cm   LV e' medial:    8.16 cm/s LV PW:         1.00 cm   LV E/e' medial:  9.1 LV IVS:        1.00 cm   LV e' lateral:   11.30 cm/s LVOT diam:     2.00 cm   LV E/e' lateral: 6.6 LV SV:         77 LV SV Index:   38 LVOT Area:     3.14 cm   RIGHT VENTRICLE             IVC RV Basal diam:  2.70 cm     IVC diam: 1.30 cm RV S prime:     10.10 cm/s TAPSE (M-mode): 2.1 cm  LEFT  ATRIUM             Index        RIGHT ATRIUM           Index LA diam:        3.50 cm 1.70 cm/m   RA Area:     11.50 cm LA Vol (A2C):   53.6 ml 26.10 ml/m  RA Volume:   24.90 ml  12.13 ml/m LA Vol (A4C):   39.1 ml 19.04 ml/m LA Biplane Vol: 45.3 ml 22.06 ml/m AORTIC VALVE LVOT Vmax:   121.00 cm/s LVOT Vmean:  82.050 cm/s LVOT VTI:    0.246 m  AORTA Ao Root diam: 3.30 cm Ao Asc diam:  3.40 cm Ao Desc diam: 2.40 cm  MV E velocity: 74.40 cm/s  TRICUSPID VALVE MV A velocity: 74.40 cm/s  TR Peak grad:   10.1 mmHg MV E/A ratio:  1.00        TR Vmax:        159.00 cm/s  SHUNTS Systemic VTI:  0.25 m Systemic Diam: 2.00 cm  Jennifer Crape MD Electronically signed by Jennifer Crape MD Signature Date/Time: 02/22/2023/12:14:17 PM    Final      CT SCANS  CT CARDIAC SCORING (SELF PAY ONLY) 12/10/2021  Addendum 12/15/2021  9:51 AM ADDENDUM REPORT: 12/15/2021 09:49  CLINICAL DATA:  Risk stratification  EXAM: Coronary Calcium Score  TECHNIQUE: The patient was scanned on a [employer] scanner. Axial non-contrast 3 mm slices were carried out through the heart. The data set was analyzed on a dedicated work station and  scored using the Agatson method.  FINDINGS: Non-cardiac: See separate report from [facility].  Ascending Aorta: Normal  Pericardium: Normal  Coronary arteries: Normal origin  IMPRESSION: Coronary calcium score of 0.   Electronically Signed By: Lamar Fitch M.D. On: 12/15/2021 09:49  Narrative EXAM: OVER-READ INTERPRETATION  CT CHEST  The following report is an over-read performed by radiologist Dr. Toribio Aye of [facility] on 12/10/2021. This over-read does not include interpretation of cardiac or coronary anatomy or pathology. The coronary calcium score interpretation by the cardiologist is attached.  COMPARISON:  None.  FINDINGS: Severe elevation of the right hemidiaphragm with subsegmental atelectasis or scarring in the right lower lobe. Within the visualized portions of the thorax there are no suspicious appearing pulmonary nodules or masses, there is no acute consolidative airspace disease, no pleural effusions, no pneumothorax and no lymphadenopathy. Visualized portions of the upper abdomen are unremarkable. There are no aggressive appearing lytic or blastic lesions noted in the visualized portions of the skeleton.  IMPRESSION: 1. Severe elevation of the right hemidiaphragm.  Electronically Signed: By: Toribio Aye M.D. On: 12/10/2021 10:36     ______________________________________________________________________________________________          Recent Labs: 12/18/2023: ALT 40; BUN 13; Creatinine, Ser 0.70; Potassium 4.0; Sodium 135  Recent Lipid Panel No results found for: CHOL, TRIG, HDL, CHOLHDL, VLDL, LDLCALC, LDLDIRECT  Physical Exam:    VS:  There were no vitals taken for this visit.    Wt Readings from Last 3 Encounters:  12/18/23 223 lb (101.2 kg)  05/08/23 201 lb (91.2 kg)  01/24/23 218 lb 6.4 oz (99.1 kg)     GEN: *** Well nourished, well developed in no acute distress HEENT:  Normal NECK: No JVD; No carotid bruits LYMPHATICS: No lymphadenopathy CARDIAC: ***RRR, no murmurs, rubs, gallops RESPIRATORY:  Clear to auscultation without rales, wheezing or rhonchi  ABDOMEN: Soft,  non-tender, non-distended MUSCULOSKELETAL:  No edema; No deformity  SKIN: Warm and dry NEUROLOGIC:  Alert and oriented x 3 PSYCHIATRIC:  Normal affect    Signed, Redell Leiter, MD  09/16/2024 8:41 AM    [facility]      [1]  No outpatient medications have been marked as taking for the 09/18/24 encounter (Appointment) with Leiter Redell PARAS, MD.   "

## 2024-09-18 ENCOUNTER — Ambulatory Visit: Attending: Cardiology | Admitting: Cardiology

## 2024-10-07 ENCOUNTER — Encounter: Admitting: Internal Medicine

## 2024-10-31 ENCOUNTER — Ambulatory Visit: Admitting: Cardiology
